# Patient Record
Sex: Female | Born: 1935 | Race: White | Hispanic: No | Marital: Married | Smoking: Former smoker
Health system: Southern US, Community
[De-identification: ages and names within clinical notes are randomized; demographics above are authoritative.]

## PROBLEM LIST (undated history)

## (undated) DIAGNOSIS — M810 Age-related osteoporosis without current pathological fracture: Secondary | ICD-10-CM

## (undated) DIAGNOSIS — E871 Hypo-osmolality and hyponatremia: Secondary | ICD-10-CM

## (undated) DIAGNOSIS — L439 Lichen planus, unspecified: Secondary | ICD-10-CM

## (undated) DIAGNOSIS — I495 Sick sinus syndrome: Secondary | ICD-10-CM

## (undated) DIAGNOSIS — S066X9A Traumatic subarachnoid hemorrhage with loss of consciousness of unspecified duration, initial encounter: Secondary | ICD-10-CM

## (undated) DIAGNOSIS — F0390 Unspecified dementia without behavioral disturbance: Secondary | ICD-10-CM

## (undated) DIAGNOSIS — Z8489 Family history of other specified conditions: Secondary | ICD-10-CM

## (undated) DIAGNOSIS — S066XAA Traumatic subarachnoid hemorrhage with loss of consciousness status unknown, initial encounter: Secondary | ICD-10-CM

## (undated) DIAGNOSIS — Z950 Presence of cardiac pacemaker: Secondary | ICD-10-CM

## (undated) DIAGNOSIS — C50919 Malignant neoplasm of unspecified site of unspecified female breast: Secondary | ICD-10-CM

## (undated) HISTORY — DX: Traumatic subarachnoid hemorrhage with loss of consciousness status unknown, initial encounter: S06.6XAA

## (undated) HISTORY — PX: APPENDECTOMY: SHX54

## (undated) HISTORY — DX: Malignant neoplasm of unspecified site of unspecified female breast: C50.919

## (undated) HISTORY — DX: Presence of cardiac pacemaker: Z95.0

## (undated) HISTORY — DX: Age-related osteoporosis without current pathological fracture: M81.0

## (undated) HISTORY — DX: Lichen planus, unspecified: L43.9

## (undated) HISTORY — PX: ABDOMINAL HYSTERECTOMY: SHX81

## (undated) HISTORY — DX: Traumatic subarachnoid hemorrhage with loss of consciousness of unspecified duration, initial encounter: S06.6X9A

## (undated) HISTORY — PX: TONSILLECTOMY: SUR1361

## (undated) HISTORY — DX: Hypo-osmolality and hyponatremia: E87.1

## (undated) HISTORY — DX: Sick sinus syndrome: I49.5

---

## 1994-12-29 DIAGNOSIS — C50919 Malignant neoplasm of unspecified site of unspecified female breast: Secondary | ICD-10-CM

## 1994-12-29 HISTORY — DX: Malignant neoplasm of unspecified site of unspecified female breast: C50.919

## 1994-12-29 HISTORY — PX: MASTECTOMY: SHX3

## 1995-12-30 HISTORY — PX: BREAST ENHANCEMENT SURGERY: SHX7

## 2000-02-28 ENCOUNTER — Ambulatory Visit (HOSPITAL_COMMUNITY): Admission: RE | Admit: 2000-02-28 | Discharge: 2000-02-28 | Payer: Self-pay | Admitting: Surgery

## 2004-06-19 ENCOUNTER — Ambulatory Visit (HOSPITAL_COMMUNITY): Admission: RE | Admit: 2004-06-19 | Discharge: 2004-06-19 | Payer: Self-pay | Admitting: Plastic Surgery

## 2004-06-19 ENCOUNTER — Ambulatory Visit (HOSPITAL_BASED_OUTPATIENT_CLINIC_OR_DEPARTMENT_OTHER): Admission: RE | Admit: 2004-06-19 | Discharge: 2004-06-19 | Payer: Self-pay | Admitting: Plastic Surgery

## 2005-06-19 ENCOUNTER — Ambulatory Visit (HOSPITAL_COMMUNITY): Admission: RE | Admit: 2005-06-19 | Discharge: 2005-06-19 | Payer: Self-pay | Admitting: Surgery

## 2008-11-24 ENCOUNTER — Ambulatory Visit: Payer: Self-pay | Admitting: Cardiology

## 2008-11-25 ENCOUNTER — Inpatient Hospital Stay (HOSPITAL_COMMUNITY): Admission: AD | Admit: 2008-11-25 | Discharge: 2008-11-30 | Payer: Self-pay | Admitting: Surgery

## 2008-11-27 ENCOUNTER — Encounter: Payer: Self-pay | Admitting: Internal Medicine

## 2008-11-27 HISTORY — PX: PACEMAKER INSERTION: SHX728

## 2008-11-28 ENCOUNTER — Ambulatory Visit: Payer: Self-pay | Admitting: Physical Medicine & Rehabilitation

## 2008-11-30 ENCOUNTER — Inpatient Hospital Stay (HOSPITAL_COMMUNITY)
Admission: RE | Admit: 2008-11-30 | Discharge: 2008-12-08 | Payer: Self-pay | Admitting: Physical Medicine & Rehabilitation

## 2008-11-30 ENCOUNTER — Ambulatory Visit: Payer: Self-pay | Admitting: Physical Medicine & Rehabilitation

## 2008-12-13 ENCOUNTER — Ambulatory Visit: Payer: Self-pay

## 2008-12-20 ENCOUNTER — Ambulatory Visit: Payer: Self-pay

## 2008-12-26 ENCOUNTER — Encounter: Admission: RE | Admit: 2008-12-26 | Discharge: 2008-12-26 | Payer: Self-pay | Admitting: Neurosurgery

## 2009-01-15 ENCOUNTER — Encounter
Admission: RE | Admit: 2009-01-15 | Discharge: 2009-04-15 | Payer: Self-pay | Admitting: Physical Medicine & Rehabilitation

## 2009-01-15 ENCOUNTER — Ambulatory Visit: Payer: Self-pay | Admitting: Physical Medicine & Rehabilitation

## 2009-02-14 ENCOUNTER — Encounter: Admission: RE | Admit: 2009-02-14 | Discharge: 2009-02-14 | Payer: Self-pay | Admitting: Neurosurgery

## 2009-02-23 ENCOUNTER — Encounter: Payer: Self-pay | Admitting: Internal Medicine

## 2009-03-02 ENCOUNTER — Ambulatory Visit: Payer: Self-pay | Admitting: Internal Medicine

## 2010-01-13 IMAGING — CT CT HEAD W/O CM
1 of 2 series · 15 of 30 positions shown, 19 images · non-contrast
Comparison: None.

CLINICAL DATA: 73-year-old female status post fall with left guide
bruising and head bleed, facial fracture.

CT HEAD WITHOUT CONTRAST
TECHNIQUE: Contiguous axial images were obtained from the base of
the skull through the vertex without contrast.

[Series 3: recon 2: brain · axial · 0.49mm/px · z∈[+122,+266]mm · 15 of 64 slices shown, 19 images]
[im 4/64  brain]
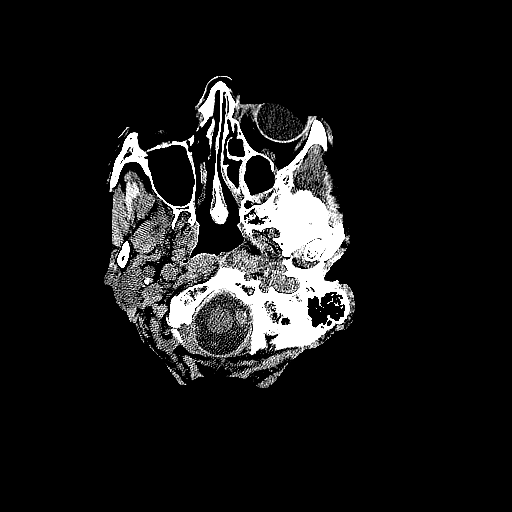
[im 4/64  bone]
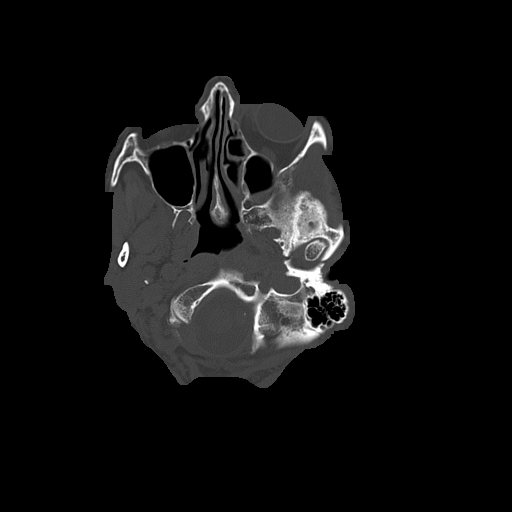
[im 7/64  brain]
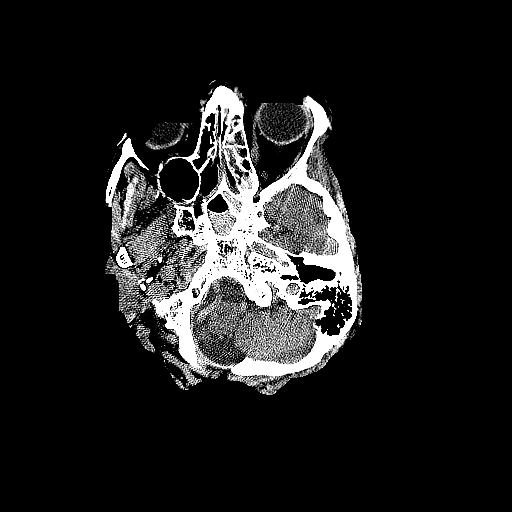
[im 14/64  brain]
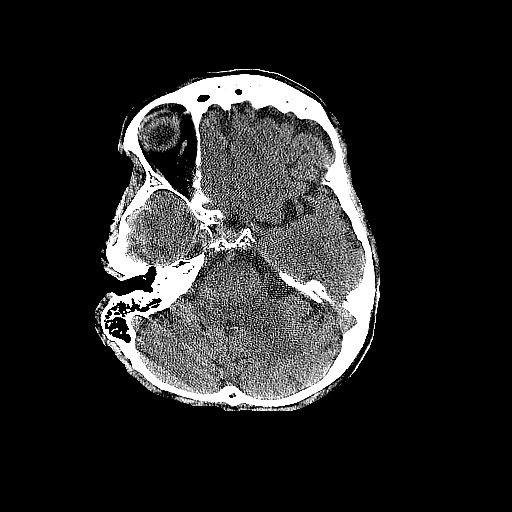
[im 17/64  brain]
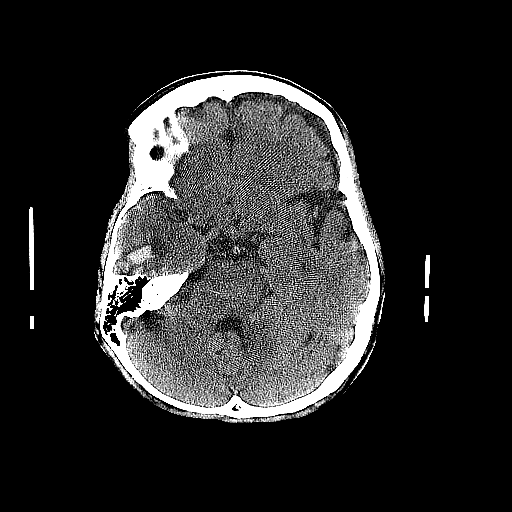
[im 20/64  brain]
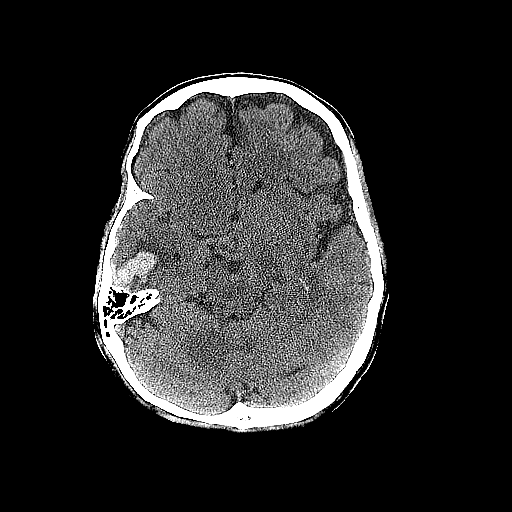
[im 20/64  bone]
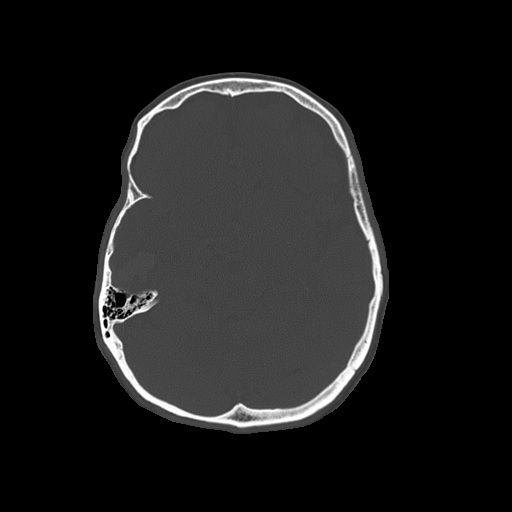
[im 24/64  brain]
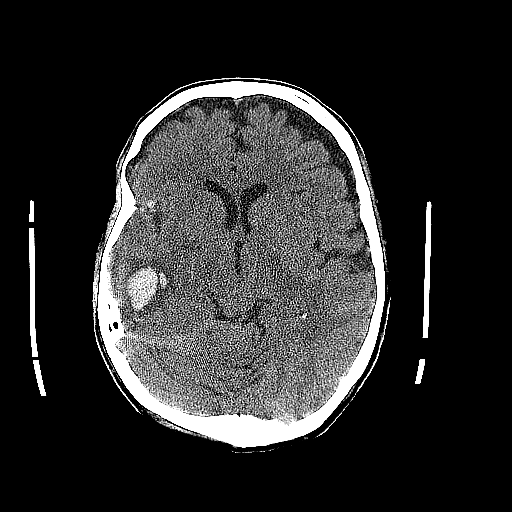
[im 27/64  brain]
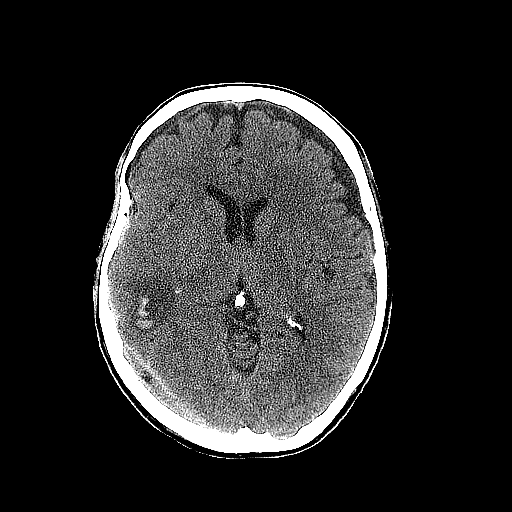
[im 34/64  brain]
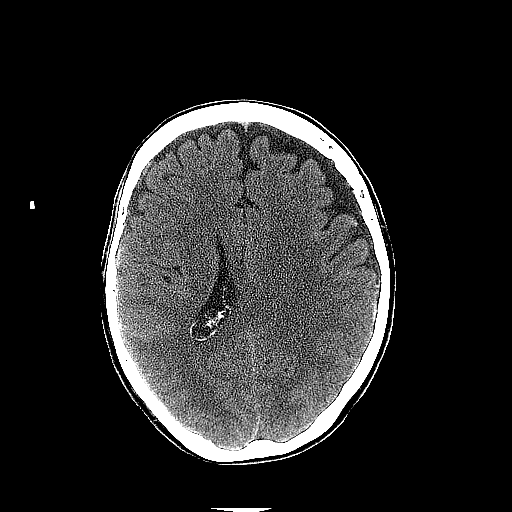
[im 37/64  brain]
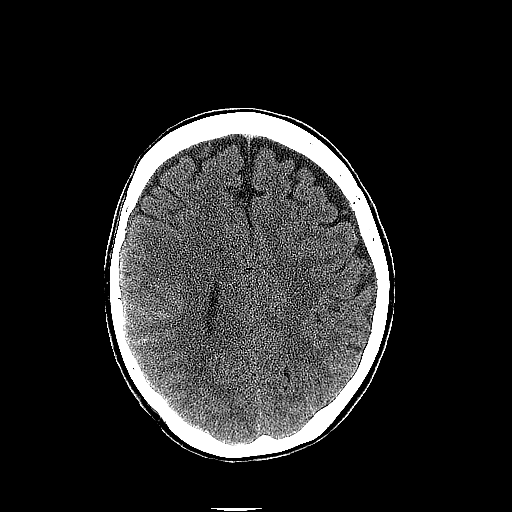
[im 37/64  bone]
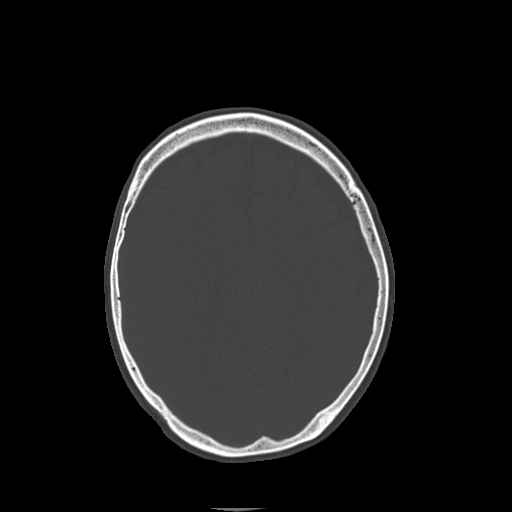
[im 40/64  brain]
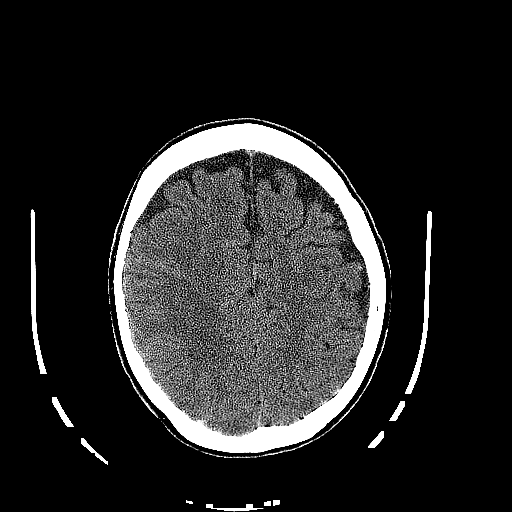
[im 44/64  brain]
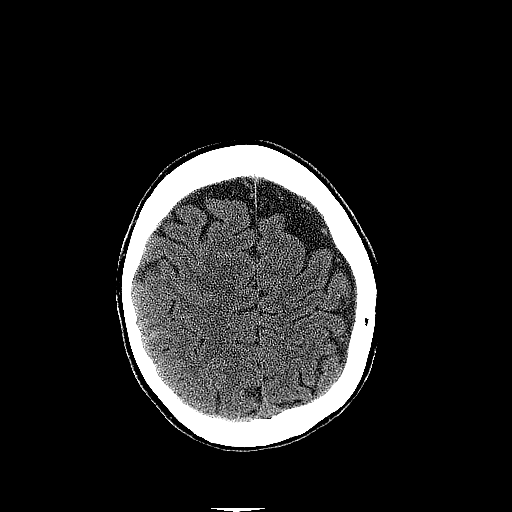
[im 47/64  brain]
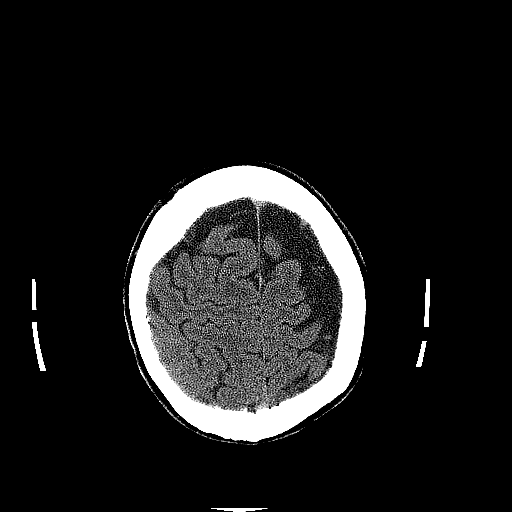
[im 54/64  brain]
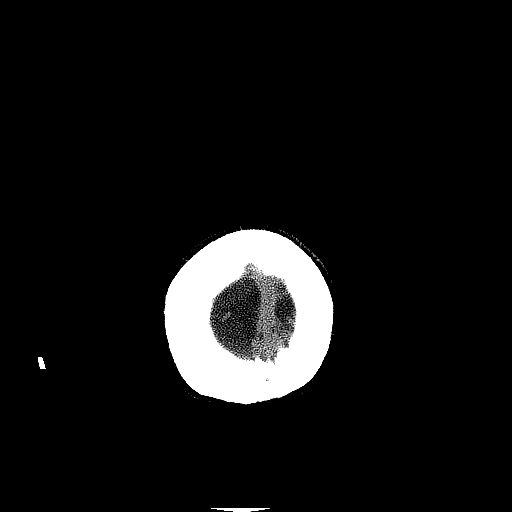
[im 54/64  bone]
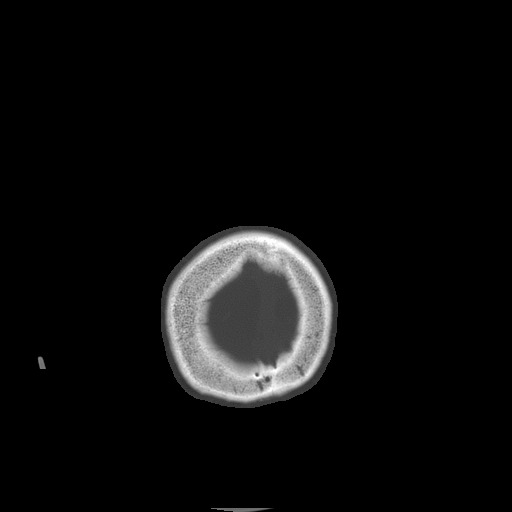
[im 57/64  brain]
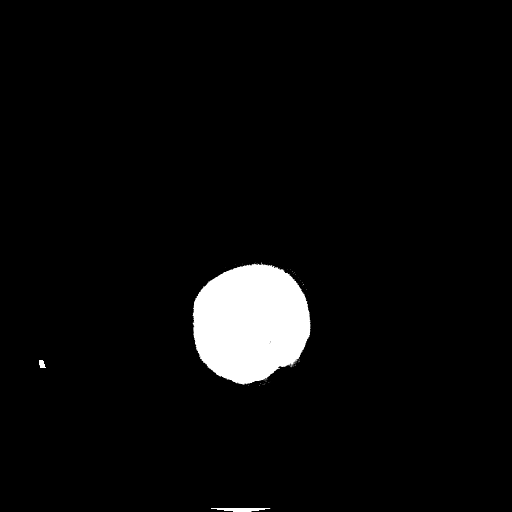
[im 60/64  brain]
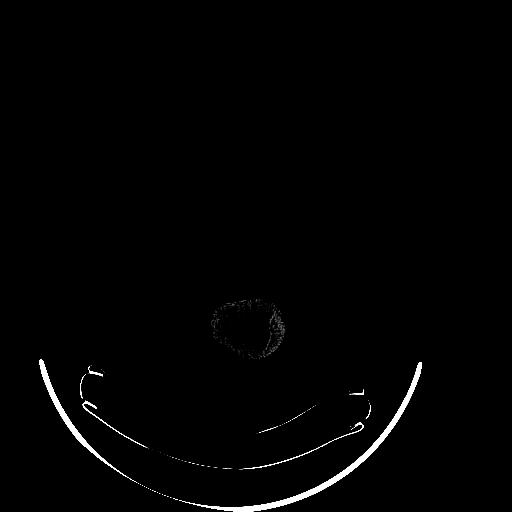

[15 of 30 positions shown; findings below may reference images not displayed]

FINDINGS: Left periorbital soft tissue hematoma partially
visualized.  The left globe appears intact.  Soft tissue swelling
extends to the left bridge of the nose.  There is a nondisplaced
fracture of the left lateral frontal bone which may involve the
lateral wall of the orbit or roof of the orbit, uncertain on these
images.  Opacification of the ethmoid and sphenoid sinuses
including an air-fluid level in the sphenoid.  Visualized mastoids
and tympanic cavities are clear.  No other calvarial fracture.

Scattered subarachnoid hemorrhage along the right cerebral
convexity.  Multi focal hemorrhagic contusions of the right
temporal lobe including the operculum, and the largest lesion of
the posterior right temporal lobe measuring 2.3 x 2.3 cm with
surrounding edema and mass effect on the right temporal horn.
There is a 5 mm left frontal convexity subdural hygroma.  No
midline shift.  No ventriculomegaly.  Incidental correlate plexus
cysts.  No intraventricular hemorrhage identified.  Basilar
cisterns are patent.  Probable small subdural hematoma versus
venous prominence in the medial left middle cranial fossa measuring
4-5 mm in thickness.  Outside of the right temporal lobe and right
operculum, gray-white matter differentiation is within normal
limits throughout the brain.
IMPRESSION: 1.  Small volume subarachnoid hemorrhage over the right convexity.
4-5 mm left frontal convexity subdural hygroma.  Possible small
left middle cranial fossa subdural hematoma.  No significant mass
effect or midline shift.
2.  Multi focal right temporal lobe hemorrhagic contusions, the
largest posteriorly measuring 2.3 cm with surrounding edema.
3.  Nondisplaced left frontal bone fracture which may involve the
left orbit.  Left periorbital and bridge of the nose soft tissue
hematoma.

## 2010-05-10 DIAGNOSIS — Z87820 Personal history of traumatic brain injury: Secondary | ICD-10-CM | POA: Insufficient documentation

## 2010-05-10 DIAGNOSIS — E871 Hypo-osmolality and hyponatremia: Secondary | ICD-10-CM | POA: Insufficient documentation

## 2010-05-13 ENCOUNTER — Ambulatory Visit: Payer: Self-pay | Admitting: Internal Medicine

## 2010-06-12 ENCOUNTER — Ambulatory Visit: Payer: Self-pay | Admitting: Internal Medicine

## 2010-09-11 ENCOUNTER — Encounter: Payer: Self-pay | Admitting: Internal Medicine

## 2010-09-11 ENCOUNTER — Ambulatory Visit: Payer: Self-pay | Admitting: Internal Medicine

## 2010-10-31 ENCOUNTER — Ambulatory Visit: Payer: Self-pay | Admitting: Internal Medicine

## 2010-12-11 ENCOUNTER — Ambulatory Visit: Payer: Self-pay | Admitting: Internal Medicine

## 2011-01-20 ENCOUNTER — Telehealth (INDEPENDENT_AMBULATORY_CARE_PROVIDER_SITE_OTHER): Payer: Self-pay | Admitting: *Deleted

## 2011-01-30 NOTE — Progress Notes (Signed)
Summary: Records Request  Faxed OV to [facility] at [facility] ([phone]). Debby Freiberg  January 20, 2011 1:23 PM

## 2011-01-30 NOTE — Cardiovascular Report (Signed)
Summary: TTM   TTM   Imported By: Roderic Ovens 10/03/2010 15:42:01  _____________________________________________________________________  External Attachment:    Type:   Image     Comment:   External Document

## 2011-01-30 NOTE — Cardiovascular Report (Signed)
Summary: Office Visit   Office Visit   Imported By: Roderic Ovens 11/07/2010 16:05:08  _____________________________________________________________________  External Attachment:    Type:   Image     Comment:   External Document

## 2011-01-30 NOTE — Cardiovascular Report (Signed)
Summary: TTM   TTM   Imported By: Roderic Ovens 07/08/2010 14:44:24  _____________________________________________________________________  External Attachment:    Type:   Image     Comment:   External Document

## 2011-01-30 NOTE — Cardiovascular Report (Signed)
Summary: TTM   TTM   Imported By: Roderic Ovens 01/03/2011 14:19:20  _____________________________________________________________________  External Attachment:    Type:   Image     Comment:   External Document

## 2011-01-30 NOTE — Assessment & Plan Note (Signed)
Summary: pc2   History of Present Illness: The patient presents today for routine electrophysiology followup. She reports doing very well since last being seen in our clinic. The patient denies symptoms of palpitations, chest pain, shortness of breath, orthopnea, PND, lower extremity edema, dizziness, presyncope, syncope, or neurologic sequela. The patient is tolerating medications without difficulties and is otherwise without complaint today.   Current Medications (verified): 1)  Chelated Molgbdenum 2)  Primal Defense .... Uad 3)  Fish Oil   Oil (Fish Oil) .... Uad  Allergies (verified): 1)  ! Codeine 2)  ! Penicillin 3)  ! Darvon 4)  ! * Darval 5)  ! Morphine 6)  ! Darvocet 7)  ! * Tape 8)  ! * Calcium  Past History:  Past Medical History: 1. Sinus node dysfunction with syncope, status post implantation of a     St. Jude Medical Zephyr XL model 5826 dual-chamber pacemaker     November 27, 2008. 2. Traumatic brain injury with a right frontal subarachnoid hemorrhage     November 2009. 3. Hyponatremia.   Vital Signs:  Patient profile:   76 year old female Height:      61 inches Weight:      87 pounds BMI:     16.50 Pulse rate:   79 / minute BP sitting:   100 / 80  (left arm)  Vitals Entered By: Laurance Flatten CMA (May 13, 2010 3:08 PM)  Physical Exam  General:  thin, NAD Head:  normocephalic and atraumatic Eyes:  PERRLA/EOM intact; conjunctiva and lids normal. Mouth:  Teeth, gums and palate normal. Oral mucosa normal. Neck:  Neck supple, no JVD. No masses, thyromegaly or abnormal cervical nodes. Chest Wall:  pacemaker pocket is well healed Lungs:  Clear bilaterally to auscultation and percussion. Heart:  Non-displaced PMI, chest non-tender; regular rate and rhythm, S1, S2 without murmurs, rubs or gallops. Carotid upstroke normal, no bruit. Normal abdominal aortic size, no bruits. Femorals normal pulses, no bruits. Pedals normal pulses. No edema, no  varicosities. Abdomen:  Bowel sounds positive; abdomen soft and non-tender without masses, organomegaly, or hernias noted. No hepatosplenomegaly. Msk:  Back normal, normal gait. Muscle strength and tone normal. Pulses:  pulses normal in all 4 extremities Extremities:  No clubbing or cyanosis. Neurologic:  Alert and oriented x 3.   PPM Specifications PPM Vendor:  St Jude     PPM Model Number:  [id number]     PPM Serial Number:  [id number] PPM DOI:  11/27/2008     PPM Implanting MD:  Hillis Range, MD  Lead 1    Location: [facility]     DOI: 11/27/2008     Model #: [id number]     Serial #: [id number]     Status: active Lead 2    Location: RV     DOI: 11/27/2008     Model #: [id number]     Serial #: [id number]     Status: active  Magnet Response Rate:  BOL 98.6 ERI 86.3  Indications:  Sick sinus syndrome   PPM Follow Up Remote Check?  No Battery Voltage:  2.80 V     Battery Est. Longevity:  8 years     Pacer Dependent:  No       PPM Device Measurements Atrium  Amplitude: 1.8 mV, Impedance: 437 ohms, Threshold: 0.75 V at 0.4 msec Right Ventricle  Amplitude: 6.1 mV, Impedance: 612 ohms, Threshold: 0.5 V at 0.4 msec  Episodes MS Episodes:  5  Percent Mode Switch:  <1     Coumadin:  No Atrial Pacing:  9     Ventricular Pacing:  1  Parameters Mode:  DDD`     Lower Rate Limit:  60     Upper Rate Limit:  120 Paced AV Delay:  200     Sensed AV Delay:  200 MD Comments:  normal pacemaker function no sustained arrhythmias  Impression & Recommendations:  Problem # 1:  PACEMAKER, ST. JUDE MEDICAL ZEPHYR XL MODEL 5826 (ICD-V45.01) Normal pacemaker function for sinus node dysfunction no changes TTMs every 3 months  Patient Instructions: 1)  Your physician recommends that you schedule a follow-up appointment in: 12 months with Dr Johney Frame

## 2011-01-30 NOTE — Cardiovascular Report (Signed)
Summary: Office Visit   Office Visit   Imported By: Roderic Ovens 05/21/2010 16:11:09  _____________________________________________________________________  External Attachment:    Type:   Image     Comment:   External Document

## 2011-01-30 NOTE — Procedures (Signed)
Summary: pacer check/st.jude   Current Medications (verified): 1)  Chelated Molgbdenum 2)  Primal Defense .... Uad 3)  Fish Oil   Oil (Fish Oil) .... Uad  Allergies (verified): 1)  ! Codeine 2)  ! Penicillin 3)  ! Darvon 4)  ! * Darval 5)  ! Morphine 6)  ! Darvocet 7)  ! * Tape 8)  ! * Calcium  PPM Specifications Following MD:  Hillis Range, MD     PPM Vendor:  St Jude     PPM Model Number:  [id number]     PPM Serial Number:  [id number] PPM DOI:  11/27/2008     PPM Implanting MD:  Hillis Range, MD  Lead 1    Location: RA     DOI: 11/27/2008     Model #: [id number]     Serial #: [id number]     Status: active Lead 2    Location: RV     DOI: 11/27/2008     Model #: 1646T     Serial #: [id number]     Status: active  Magnet Response Rate:  BOL 98.6 ERI 86.3  Indications:  Sick sinus syndrome   PPM Follow Up Remote Check?  No Battery Voltage:  2.80 V     Battery Est. Longevity:  9.75 years     Pacer Dependent:  No       PPM Device Measurements Atrium  Amplitude: 2.0 mV, Impedance: 412 ohms, Threshold: 0.75 V at 0.4 msec Right Ventricle  Amplitude: 6.1 mV, Impedance: 460 ohms, Threshold: 0.375 V at 0.4 msec  Episodes MS Episodes:  0     Percent Mode Switch:  0     Coumadin:  No Atrial Pacing:  11%     Ventricular Pacing:  <1%  Parameters Mode:  DDD`     Lower Rate Limit:  60     Upper Rate Limit:  120 Paced AV Delay:  200     Sensed AV Delay:  200 Next Cardiology Appt Due:  04/29/2011 Tech Comments:  Ventricular autocapture on today.  Device function normal.  ROV 6 months with Dr. Johney Frame. Altha Harm, LPN  October 31, 2010 3:25 PM

## 2011-03-12 ENCOUNTER — Encounter: Payer: Self-pay | Admitting: Internal Medicine

## 2011-03-12 DIAGNOSIS — I495 Sick sinus syndrome: Secondary | ICD-10-CM

## 2011-04-30 ENCOUNTER — Encounter: Payer: Self-pay | Admitting: Internal Medicine

## 2011-05-02 ENCOUNTER — Ambulatory Visit (INDEPENDENT_AMBULATORY_CARE_PROVIDER_SITE_OTHER): Payer: Medicare Other | Admitting: Internal Medicine

## 2011-05-02 ENCOUNTER — Encounter: Payer: Self-pay | Admitting: Internal Medicine

## 2011-05-02 DIAGNOSIS — I495 Sick sinus syndrome: Secondary | ICD-10-CM

## 2011-05-02 NOTE — Progress Notes (Signed)
The patient presents today for routine electrophysiology followup.  Since last being seen in our clinic, the patient reports doing very well.  Today, she denies symptoms of palpitations, chest pain, shortness of breath, orthopnea, PND, lower extremity edema, dizziness, presyncope, syncope, or neurologic sequela.  She remains very active and enjoys yard work and volunteering at her church. The patient feels that she is tolerating medications without difficulties and is otherwise without complaint today.   Past Medical History  Diagnosis Date  . Sinus node dysfunction     with syncope; status post implantation of a St. Jude Medical Zephyr XL model 5826 dual-chamber pacemaker  November 27, 2008.  . Subarachnoid hemorrhage following injury     right frontal, following traumatic brain injury  . Hyponatremia    Past Surgical History  Procedure Date  . Pacemaker insertion 11/27/08    by Fawn Kirk [facility])    Current Outpatient Prescriptions  Medication Sig Dispense Refill  . Fish Oil OIL by Does not apply route. uad         Allergies  Allergen Reactions  . Calcium   . Codeine   . Morphine   . Penicillins   . Propoxyphene Hcl   . Propoxyphene N-Acetaminophen     History   Social History  . Marital Status: Married    Spouse Name: N/A    Number of Children: N/A  . Years of Education: N/A   Occupational History  . Not on file.   Social History Main Topics  . Smoking status: Former Games developer  . Smokeless tobacco: Not on file  . Alcohol Use: No  . Drug Use: No  . Sexually Active: Not on file   Other Topics Concern  . Not on file   Social History Narrative  . No narrative on file   Physical Exam: Filed Vitals:   05/02/11 1105  BP: 101/75  Pulse: 77  Height: 5\' 2"  (1.575 m)  Weight: 87 lb (39.463 kg)    GEN- The patient is well appearing, alert and oriented x 3 today.   Head- normocephalic, atraumatic Eyes-  Sclera clear, conjunctiva pink Ears- hearing intact Oropharynx-  clear Neck- supple, no JVP Lymph- no cervical lymphadenopathy Lungs- Clear to ausculation bilaterally, normal work of breathing Chest- pacemaker pocket is well healed Heart- Regular rate and rhythm, no murmurs, rubs or gallops, PMI not laterally displaced GI- soft, NT, ND, + BS Extremities- no clubbing, cyanosis, or edema MS- no significant deformity or atrophy Skin- no rash or lesion Psych- euthymic mood, full affect Neuro- strength and sensation are intact  Pacemaker interrogation- reviewed in detail today,  See PACEART report  Assessment and Plan:

## 2011-05-02 NOTE — Assessment & Plan Note (Signed)
Normal pacemaker function See Pace Art report No changes today  

## 2011-05-13 NOTE — H&P (Signed)
[name], [name]             ACCOUNT NO.:  [id number]   MEDICAL RECORD NO.:  [id number]          PATIENT TYPE:  IPS   LOCATION:  4033                         FACILITY:  [facility]   PHYSICIAN:  Ellwood Dense, M.D.   DATE OF BIRTH:  Mar 12, 1935   DATE OF ADMISSION:  11/30/2008  DATE OF DISCHARGE:                              HISTORY & PHYSICAL   PRIMARY CARE PHYSICIAN:  Dr. Catha Gosselin.   NEUROSURGEON:  Dr. Lovell Sheehan.   EAR NOSE AND THROAT:  Dr. Suszanne Conners.   TRAUMA:  Dr. Diona Browner.   HISTORY OF PRESENT ILLNESS:  [name] is a 76 year old Caucasian  female who has been recently living in [place] but was [place] at a  [place] home [place] in the [facility] area.  She fell on November 25, 2008  in the kitchen of that home and the fall was unwitnessed.  She was taken  to an outside hospital in [facility] and a CAT scan showed scattered  subarachnoid hemorrhage with hemorrhagic contusion of the right frontal  lobe and a nondisplaced fracture of the left frontal lobe, left orbit  and left zygomatic arch.  The patient was transferred to [facility] on November 25, 2008 for follow-up evaluation and treatment.  A  CAT scan of the cervical spine showed degenerative changes and  spondylosis at C4-C7.  The patient was evaluated by trauma doctors and  neurosurgery.  Dr. Lovell Sheehan evaluated the patient and recommended  conservative care with serial CTs of her brain.  Dr. Suszanne Conners from ear, nose  and throat evaluated the patient and recommended followup after  discharge with no surgical intervention.  Trauma did not recommend any  surgical intervention.   The patient was noted to have episodes of asystole on November 26, 2008  and bradycardia.  [facility] was consulted for input.  The  patient was noted to have sick sinus syndrome and a pacemaker was  recommended by Dr. Johney Frame.  Transesophageal echocardiogram showed an  ejection fraction of 60% with mild-to-moderate tricuspid  valve  regurgitation.  A permanent pacemaker was placed November 27, 2008.  The  patient has had issues regarding confusion secondary to a traumatic  brain injury with some complaints of headaches.  Hypokalemia was noted  and the patient has been supplemented.  Therapies have been ongoing.  The patient shows some confusion with problems with memory and  occasional loss of balance and dizziness.  She does need cues to use her  left upper extremity appropriately.   Follow-up cranial CT November 27, 2008 showed stable to mildly increased  right posterior temporal lobe hemorrhagic contusion with stable edema  and mass effect.  The patient also had stable to slightly increased left  subdural hygroma noted.   The patient was evaluated by the rehabilitation physicians and felt to  be an appropriate candidate for inpatient rehabilitation.   REVIEW OF SYSTEMS:  Positive for weakness, wound healing of her left  chest, and bruising of her left eye.   PAST MEDICAL HISTORY:  1. COPD.  2. Osteoporosis.  3. Breast cancer with prior mastectomy with reconstruction.  4.  History of syncope.  5. Hysterectomy.  6. Diverticulosis.  7. A history of left wrist fracture.  8. Prior appendectomy.  9. Prior C-section.   FAMILY HISTORY:  Positive for coronary artery disease.   SOCIAL HISTORY:  The patient is married, lives with her husband in a one-  level home in [place] with no steps to enter.  She does not use  tobacco and reports occasional alcohol usage.   FUNCTIONAL HISTORY PRIOR TO ADMISSION:  Independent and driving prior to  admission.   FUNCTIONAL STATUS AT PRESENT:  Min assist bed mobility and transfers,  mod assist ambulation 3-4 steps with increasing levels of anxiety.  The  patient remains internally distracted with speech therapy evaluation.  She shows need for moderate assist for upper body care during  occupational therapy evaluations.   ALLERGIES:  DARVOCET AND AMOXICILLIN.    MEDICATIONS PRIOR TO ADMISSION:  Boniva.   LABORATORY:  Most recent hemoglobin was 12.6 with a hematocrit of 37.1,  platelet count of 149,000, and white count of 8.3.  Most recent sodium  was 133, potassium 4.0, chloride 98, bicarbonate 28, BUN 9, creatinine  0.40, glucose 97.  Chest x-ray November 28, 2008 showed no acute  infiltrate or edema with permanent pacemaker in place.  Recent TSH was  normal at 0.775.   PHYSICAL EXAM:  A reasonably well-appearing, elderly small adult female  lying in bed with a large bandage over her left chest and significant  bruising of her left orbit.  Blood pressure is 135/85 with a pulse of 67, respiratory rate 18, and  temperature 99.0.  HEENT:  Normocephalic apart from the bruising around her left eye and  left face.  No surgical wounds were present on her scalp.  There was no  obvious skin breakdown or laceration of her scalp.  CARDIOVASCULAR:  Regular rate and rhythm.  S1, S2 without murmurs.  ABDOMEN:  Soft, nontender, with positive bowel sounds.  LUNGS:  Clear to auscultation bilaterally.  Chest showed a dry dressing  over the permanent pacemaker in her left upper chest.  NEUROLOGIC:  Alert and oriented x2-3 with occasional cues.  The patient  has 4+/5 strength throughout the bilateral upper and lower extremities.  Bulk and tone were normal.  Sensation was intact to light touch  throughout the bilateral upper extremities.  Cranial nerve exam was  unremarkable with good eye closure and good facial symmetry with tongue  in the midline.  Bilateral lower extremity exam showed 4-/5 strength  throughout.  Bulk was decreased and tone was normal.   DIAGNOSES:  1. Status post fall with traumatic brain injury with right frontal      hemorrhagic contusion and right frontal skull fracture along with      right orbital and zygomatic arch fractures along with scattered      subarachnoid hemorrhage.  2. History of syncope with recent diagnosis of sick sinus  syndrome      with permanent pacemaker placed November 27, 2008.  3. Hyponatremia possibly related to cerebral salt wasting secondary to      traumatic brain injury.  Will recheck electrolytes in the morning.  4. Deep vein thrombosis prophylaxis with no blood thinners secondary      to recent cranial injury and subarachnoid hemorrhage with ongoing      pneumatic air stockings for deep vein thrombosis prophylaxis.   Presently the patient will be admitted to receive collaborative and  interdisciplinary care between the physiatrist, rehab nursing staff and  therapy team, at least 3 hours per day at least 5 days per week.  The  patient's level of medical complexity and substantial therapy needs in  context of that medical necessity cannot be provided at a lesser  intensity of care.  Physiatrist will provide 24-hour management of  medical needs as well as oversight of therapy, plan/treatment, and  provide guidance as appropriate regarding interaction of the two and 24-  hour rehab nursing will assist in management of bowel and bladder and  help integrate therapy concepts, techniques and education.  1. Physical therapy will assess and treat for range of motion,      strengthening, bed mobility, transfers, pre gait training, gait      training and equipment.  2. Occupational therapy will assess and treat for range of motion,      strengthening, activities of daily living,  cognitive/perceptional      training, splinting and equipment eval.  3. Speech therapy will assess and treat for dysphagia, oral motor      exercise, communication aids, aphasia and VitalStim as necessary.  4. Case management and social worker will assess and treat for      psychosocial issues and discharge planning as appropriate.  Team      conferences will be held weekly to establish goals, assess progress      and determine barriers to discharge.   PROGNOSIS:  Good.   ESTIMATED LENGTH OF STAY:  5-12 days.   GOALS:   Modified independent to standby assist with ADLs, transfers and  ambulation.           ______________________________  Ellwood Dense, M.D.     DC/MEDQ  D:  11/30/2008  T:  11/30/2008  Job:  [id number]

## 2011-05-13 NOTE — Assessment & Plan Note (Signed)
[facility]                         ELECTROPHYSIOLOGY OFFICE NOTE   NAME:Kemmerling, RUPINDER LIVINGSTON                    MRN:          [id number]  DATE:03/02/2009                            DOB:          09-28-1935    INTRODUCTION:  Ms. [name] is a pleasant 76 year old female with sinus  node dysfunction status post implantation of a St. [employer] Medical Zephyr  XL model 5826 dual-chamber pacemaker, presents today for followup.   PROBLEM LIST:  1. Sinus node dysfunction with syncope, status post implantation of a      St. [employer] Medical Zephyr XL model 5826 dual-chamber pacemaker      November 27, 2008.  2. Traumatic brain injury with a right frontal subarachnoid hemorrhage      November 2009.  3. Hyponatremia.   CURRENT MEDICATIONS:  1. Fish oil daily.  2. Multivitamin daily.   INTERVAL HISTORY:  Ms. [name] presents today for followup.  She fell  and sustained a traumatic brain injury in November 2009.  She was  evaluated by me and underwent implantation of a St. Jude Medical Zephyr  XL dual-chamber pacemaker November 27, 2008.  She was found to have had  prolonged pauses while on telemetry during her hospital stay and the  pacemaker was therefore implanted for sinus node dysfunction.   She reports doing very well since her hospital discharge.  She continues  to make a remarkable recovery.  She has occasional dizziness which  appears to be neurologic in origin.  She denies chest pain, shortness of  breath, palpitations, presyncope, syncope, fevers, chills or other  concerns.  She has had an uneventful course since her pacemaker was  implanted and is otherwise done quite well.   PHYSICAL EXAMINATION:  VITALS:  Blood pressure 115/80, heart rate 77,  respirations 18, weight 94 pounds.  GENERAL:  The patient is a very thin female in no acute distress.  She  is alert and oriented x3.  HEENT:  Normocephalic, atraumatic.  Sclerae clear.  Conjunctivae pink.  Oropharynx clear.  NECK:  Supple.  No JVD, thyromegaly, or bruits.  LUNGS:  Clear to auscultation bilaterally.  HEART:  Regular rate and rhythm.  No murmurs, rubs or gallops.  GI:  Soft, nontender, nondistended, positive bowel sounds.  EXTREMITIES:  No clubbing, cyanosis or edema.  NEUROLOGIC:  Strength and sensation are intact.  SKIN:  No ecchymoses or laceration.  MUSCULOSKELETAL:  No deformity or atrophy.  PSYCH:  Euthymic, mood flat, full affect.   The pacemaker pocket is well healed with no evidence of hematoma or  infection.   EKG reveals sinus rhythm at 77 beats per minute and is otherwise  unremarkable.   DEVICE INTERROGATION:  The patient's dual-chamber pacemaker is  interrogated today and found to be functioning appropriately in the DDD  pacing mode with a lower rate limit of 60 and upper tracking rate of 120  beats per minute.  The battery status is good with a voltage of 2.800  volts and an estimated longevity of 6-10 years.  The atrial lead P-wave  measures 1.4 mV with an impedance of 455 ohms and a  threshold of 0.75  volts at 0.56 milliseconds.  The right ventricular lead R-wave measures  9.9 mV with an impedance is 650 ohms and a threshold of 0.5 volts at 0.5  milliseconds.  The underlying rhythm is sinus rhythm.  The patient is  paced in the atrium and ventricle less than 1% at that time.  The AV  delays are programmed at 200/200 milliseconds.  The atrial and  ventricular pacing outputs were decreased today 2 volts of 0.4  milliseconds and 2.5 volts at 0.4 milliseconds respectively.  No other  programming changes were made today.   IMPRESSION:  1. Sinus node dysfunction.  2. Normal pacemaker function.   PLAN:  1. No medication changes were made today.  2. The patient was reprogrammed as described above to promote battery      longevity.  3. The patient will follow up with me annually.  She was instructed to      contact me at any time should any problems  arise.     Hillis Range, MD  Electronically Signed    JA/MedQ  DD: 03/02/2009  DT: 03/03/2009  Job #: [id number]   cc:   Cristi Loron, M.D.  Anna Genre Little, M.D.

## 2011-05-13 NOTE — Discharge Summary (Signed)
[name], [name]             ACCOUNT NO.:  [id number]   MEDICAL RECORD NO.:  [id number]          PATIENT TYPE:  INP   LOCATION:  3023                         FACILITY:  [facility]   PHYSICIAN:  [name]. [name], M.D.DATE OF BIRTH:  1935-07-22   DATE OF ADMISSION:  11/25/2008  DATE OF DISCHARGE:  11/30/2008                               DISCHARGE SUMMARY   ADMITTING TRAUMA SURGEON:  Elaine Bales. Ezzard Standing, MD   CONSULTANTS:  1. Elaine Loron, MD, Neurosurgery.  2. Elaine Pies, MD, ENT.  3. Elaine Sidle, MD, [facility].   DISCHARGE DIAGNOSES:  1. Fall from level ground.  2. Traumatic brain injury with intracerebral contusion, subarachnoid      hemorrhage, and small subdural hematoma.  3. Left frontal skull fracture.  4. Zygoma and orbital wall fractures on left.  5. Sick sinus syndrome with sinus arrest.  6. Osteoporosis.  7. Hypokalemia, resolved.  8. Mild hyponatremia.   PROCEDURES:  Dual-chamber pacemaker placement on November 27, 2008, Dr.  Johney Gallagher.   HISTORY:  This is an otherwise fairly healthy 76 year old white female  who was at her [name] in [place] when she had an  unwitnessed fall.  She struck the left side of her head and face.  She  was seen [name] [place], and a head  CT scan there showed some scattered subarachnoid hemorrhage and small  subdural as well as an intracerebral contusion in the right temporal  lobe.  She had a nondisplaced left frontal bone fracture and  nondisplaced fractures of the left orbital wall and left zygomatic arch.  She had degenerative changes of her cervical spine but no acute  fractures.  The patient was accepted in transfer from [name],  as she lives locally in [name] with her husband.  She was admitted  to the Neuro Intensive Care Unit and a consultation was obtained from  Dr. Lovell Sheehan for her intracerebral trauma, and he reviewed the patient's  scan and felt that she  had a small right subdural hematoma without mass  effect and small bilateral subarachnoid hemorrhages in the sylvian  fissure.  He also noted the patient's nondisplaced facial and left  frontal skull fractures.  It was recommended the patient be observed in  the Neuro Intensive Care Unit initially and this was done and the  patient did well.  She remained generally somnolent but arousable and  became much more alert the day following her admission.  She had some  mild orientation difficulties.  The followup CT scan showed new small  right temporal contusion, right subdural hematoma, and some subarachnoid  hemorrhage; all of which were felt to be stable and the patient was  clinically stable and improving.  The patient was seen in consultation  by Dr. Suszanne Conners for her facial fractures, and these were felt to be  nondisplaced and conservative treatment was recommended.  The patient  can follow up with Dr. Suszanne Conners following her discharge from the [facility].  The patient had been doing well; however, she was noted to have a long  episode of asystole/sinus  arrest and this was felt to be due to sick  sinus syndrome.  An urgent [facility] consultation was obtained from  [name] following this episode from Dr. Diona Browner, and the  patient subsequently underwent dual-chamber permanent pacemaker  placement on November 27, 2008.  She tolerated all of this well except  for some mild over sedation with the medications during the pacemaker  placement.  She did experience some confusion following this, but this  rapidly improved and she was making progress with her rehabilitation,  and it was recommended that she be admitted for a brief inpatient  rehabilitation stay to address her continued deficits following her fall  with intracerebral contusion, subarachnoid and subdural hematomas.  The  patient is being transferred to the [facility]  here at [name] at this time for this  purpose.   MEDICATIONS AT THE TIME OF DISCHARGE:  1. Norco 5/325 mg 1-2 p.o. q.4 h. p.r.n. pain.  2. She is receiving a last dose of potassium chloride 20 mEq x1 this      morning.  3. She was also started on sodium chloride tablets 1 p.o. t.i.d. with      meals for some mild hyponatremia and this will continue to require      monitoring.   Following her discharge, she should be followed by [name],  by Dr. Lovell Sheehan of Neurosurgery, and Dr. Suszanne Conners, ENT.  She does not need  formal followup with the Trauma Service.   Diet is regular.      Shawn Rayburn, P.A.      [name], M.D.  Electronically Signed    SR/MEDQ  D:  11/30/2008  T:  11/30/2008  Job:  [id number]   cc:   Elaine Sidle, MD  [name], M.D.  Elaine Pies, MD  [name]

## 2011-05-13 NOTE — Op Note (Signed)
NAMEALLYANNA, APPLEMAN             ACCOUNT NO.:  [id number]   MEDICAL RECORD NO.:  [id number]          PATIENT TYPE:  INP   LOCATION:  3102                         FACILITY:  [facility]   PHYSICIAN:  Hillis Range, MD       DATE OF BIRTH:  1935-07-27   DATE OF PROCEDURE:  DATE OF DISCHARGE:                               OPERATIVE REPORT   SURGEON:  Hillis Range, MD   PREPROCEDURE DIAGNOSES:  1. Syncope.  2. Sick sinus syndrome.  3. Symptomatic sinus bradycardia.   PROCEDURES:  1. Dual-chamber pacemaker implantation.  2. Left upper extremity venography   INTRODUCTION:  Ms. Cardamone is a pleasant 76 year old female who was  admitted following a syncopal episode which occurred on November 25, 2008.  Following admission, she was observed on telemetry and found to  have prolonged ventricular pauses.  She had a 11.2-second pause followed  by a 23-second pause with subsequent syncope.  She was found to have a  preserved ejection fraction with no reversible causes of bradycardia  observed.  She therefore presents for dual-chamber pacemaker  implantation.   DESCRIPTION OF THE PROCEDURE:  Informed written consent was obtained and  the patient was brought to the Electrophysiology Lab in a fasting state.  She was adequately sedated with intravenous Valium, Versed and fentanyl  as outlined in the nursing report.  The patient's left chest wall was  infiltrated with lidocaine for local analgesic.  A 5-cm incision was  then made over the left deltopectoral region.  A subcutaneous pacemaker  pocket was fashioned using a combination of blunt and sharp dissection.  Electrocautery was used to assure hemostasis.  The left cephalic vein  could not be visualized.  A venogram of the left upper extremity was  therefore performed by hand injection of nonionic contrast which  demonstrated small but patent left axillary and left subclavian veins.  The left axillary vein was cannulated and through this vein  a St. Jude  Medical Tendril SDX model 1688 TC - 46 (serial number [id number]) right  atrial lead and a St. Jude Medical IsoFlex S model 1646T - 58 (serial  number [id number]) right ventricular lead were advanced with fluoroscopic  visualization into the right atrial appendage and right ventricular apex  positions respectively.  The initial lead measurements revealed an  atrial lead P-wave of 3.2 mV with an impedance of 813 ohms and a  threshold of 0.6 volts at 0.5 milliseconds.  The right ventricular lead,  the R-wave measured 14.4 mV with an impedance of 904 ohms and a  threshold of 0.3 volts at 0.5 milliseconds.  The leads were then  connected to a St. Jude Medical Zephyr XL DR model [phone] (serial number  [id number]) dual-chamber pacemaker.  The pocket was then irrigated with  copious gentamicin solution.  The pacemaker was then placed into the  left subcutaneous pacemaker pocket.  The device was secured to the  pectoralis fascia using a single #2 silk suture.  The leads were secured  to the pectoralis fascia using #2 silk sutures over the suture sleeves.  The pocket was then closed with  2-0 Vicryl suture for the subcutaneous  and subcuticular layers.  There were no early apparent complications.   CONCLUSIONS:  1. Successful implantation of a Zephyr XL dual-chamber pacemaker as      outlined above.  2. No early apparent complications.      Hillis Range, MD  Electronically Signed     JA/MEDQ  D:  11/27/2008  T:  11/28/2008  Job:  [id number]   cc:   Jonelle Sidle, MD  Gabrielle Dare. Janee Morn, M.D.

## 2011-05-13 NOTE — H&P (Signed)
NAMEKAILYNNE, FERRINGTON             ACCOUNT NO.:  [id number]   MEDICAL RECORD NO.:  [id number]          PATIENT TYPE:  INP   LOCATION:  3102                         FACILITY:  [facility]   PHYSICIAN:  Sandria Bales. Ezzard Standing, M.D.  DATE OF BIRTH:  1935/07/09   DATE OF ADMISSION:  11/25/2008  DATE OF DISCHARGE:                              HISTORY & PHYSICAL   Date of admission ??   HISTORY OF PRESENT ILLNESS:  Ms. Talamante is a 76 year old white  [place].  This  history is taken much from her husband.  Apparently, in trying to talk  to her, she felt she may have misstepped, though according to her  husband, there is a questionable fall maybe a couple of months ago,  which again was sort of unwitnessed, but she did not have any real  problems with.   At this time, she went to the [facility] [place] in  [place].  [place] and I was called by the ER physician there for consideration for  transfer, to our trauma service.   In [place], they did a CT of her head.  This showed scattered regions  of subarachnoid hemorrhage in the right sylvian fissure and bilateral  parietotemporal sulci.  She had a hemorrhagic contusion about 5 mm in  her right temporal lobe and nondisplaced fracture of left frontal bone.  There also was a nondisplaced fracture of her anterior wall of her left  orbit and nondisplaced fracture of the left zygomatic arch, has some  soft tissue swelling in the left face.   She had a CT scan of her neck, which showed degenerative disease at C4-5  and C5-6, but no obvious acute fractures and she has some paranasal  sinus disease.   Her chest x-ray exclusive of thoracic spine were unremarkable.  Her  chest x-ray shows some chronic emphysematous changes.   I accepted her transfer.   PAST MEDICAL HISTORY:  She has had no allergies.  I think her only  medication is Boniva.   REVIEW OF SYSTEMS:  NEUROLOGIC:  She has had no  prior seizure or loss of  consciousness  according to her husband.  She may had a fall a couple of  months ago.  So a only question is there a reason for a syncopal workup,  but her current presentation appears to be a fall.  GASTROINTESTINAL:  She has had no peptic ulcer disease or liver disease.  UROLOGIC:  No kidney disease.   PHYSICAL EXAMINATION:  GENERAL:  She is arousable.  She moves her upper  and lower extremities with intention, is alert, cooperative, may be a  little bit drowsy.  She is a thin woman.  HEENT:  She has contusion, bruising around her left eye.  Her pupils,  however, are about 3-4 mm and bilaterally reactive.  She has inner soft  tissue swelling of her left face.  She has no obvious tooth fractures.  NECK:  Nontender.  She moves without pain.  She has no cervical or  supraclavicular adenopathy.  LUNGS:  Clear to auscultation.  Her breath sounds seem symmetric.  HEART:  Regular rate and rhythm without murmur or rub.  ABDOMEN:  Soft.  I feel no mass.  Her pelvis is stable.  EXTREMITIES:  She has no obvious fracture or injury to upper and lower  extremities.  She is right-handed and has good strength in upper and  lower extremities.   LABORATORIES:  Labs that I have from [facility]  shows a BUN of 14, creatinine of 0.6, glucose of 116, potassium 3.7.  Her hemoglobin is 12.9, hematocrit 37, and white blood count of 6400.  EKG showed a normal sinus rhythm.   IMPRESSION:  1. Fall with intracranial facial injuries.  2. Scattered subarachnoid hemorrhage.  I have asked Dr. Tressie Stalker to see her in consultation.  We will plan on repeat CT scan      in the morning.  3. Left frontal bone, left orbit, left zygomatic arch fractured, which      were undisplaced.  I have asked Dr. Suszanne Conners to see her from a facial      fracture standpoint.  4. History of osteoporosis.  5. Chronic obstructive pulmonary disease on chest x-ray.      Sandria Bales.  Ezzard Standing, M.D.  Electronically Signed     DHN/MEDQ  D:  11/25/2008  T:  11/26/2008  Job:  [id number]   cc:   Caryn Bee L. Little, M.D.  Cristi Loron, M.D.  Newman Pies, MD

## 2011-05-13 NOTE — Consult Note (Signed)
[name], [name]             ACCOUNT NO.:  [id number]   MEDICAL RECORD NO.:  [id number]          [facility] TYPE:  INP   LOCATION:  3102                         FACILITY:  [facility]   PHYSICIAN:  Hillis Range, MD       DATE OF BIRTH:  [birth year]   DATE OF CONSULTATION:  DATE OF DISCHARGE:                                 CONSULTATION   CONSULTING PHYSICIAN:  Jonelle Sidle, MD   REASON FOR CONSULTATION:  Symptomatic bradycardia.   HISTORY OF PRESENT ILLNESS:  [name] is a pleasant 76 year old  female without significant past medical history who is admitted on  November 25, 2008, following a syncopal episode.  [name]'s spouse  reports that she was in her usual state of health ambulating within  there home when he heard her collapse to Elaine floor.  Upon arriving into  [facility] room, [name] was found to be lying on Elaine floor unconscious.  She apparently regained consciousness within several seconds and was  taken to [facility] [place].  She was found to have  facial fractures, a subarachnoid hemorrhage, a subdural hematoma, and  hemorrhagic contusions.  She was brought to [facility] for  further evaluation.  On November 26, 2008, while resting in her hospital  bed [name] again lost consciousness.  During Elaine episode, she was  observed to have 11.9 second pause followed by an junctional escape beat  and then a 23.6 second pause followed by several additional junctional  beats before returning to sinus bradycardia.  [name] lost  consciousness during this event, but subsequently woke on her own  without resuscitation.  She has had no further erythremia since that  time.   Upon further discussion with [name]'s spouse she has had a prior  episode of syncope a several weeks ago at which time she again fell, but  did not sustain significant injury.  She did not seek medical attention  at that time.  Over Elaine past several years, [name]  is felt to have  multiple episodes of lightheadedness and presyncope without major  injury.  She denies chest discomfort, shortness of breath, palpitations,  or symptoms of heart failure.  She is otherwise without complaint.   ALLERGIES:  DARVOCET and AMOXICILLIN.   HOME MEDICATIONS:  Boniva.   PAST MEDICAL HISTORY:  1. Breast cancer status post right mastectomy and axillary dissection      in Elaine late 1990s.  2. Osteoporosis.  3. Remote tobacco use.  4. Hypertension.   SOCIAL HISTORY:  [name] is married and lives with her spouse in  [place].  She has a remote history of tobacco, but quit approximately  15 years ago.  She drinks occasional wine and denies drug use.   FAMILY HISTORY:  [name]'s mother had congestive heart failure in  her 18s.   REVIEW OF SYSTEMS:  All systems were reviewed and negative except as  outlined in Elaine HPI above.   PHYSICAL EXAMINATION:  VITAL SIGNS:  Blood pressure 143/75, heart rate  68, respirations 18, temperature 98.1 (T-max 100.2), and sats  100% on  room air.  GENERAL:  [name] is a thin female in no acute distress.  She is  sleeping, but arouses.  HEENT:  [name] has a left periorbital ecchymosis with a swelling.  Sclerae clear.  Conjunctivae pink.  Oropharynx is clear.  NECK:  Supple.  No thyromegaly, lymphadenopathy, or JVD.  No bradycardia  to carotid sinus massage.  LUNGS:  Clear to auscultation bilaterally.  HEART:  Regular rate and rhythm.  No murmurs, rubs, or gallops.  GI:  Soft, nontender, and nondistended.  Positive bowel sounds.  EXTREMITIES:  No clubbing, cyanosis, or edema.  SKIN:  [name] has ecchymosis over Elaine left periorbital region;  otherwise, without laceration.  MUSCULOSKELETAL:  No deformity or atrophy.  PSYCH:  Euthymic mood.  Full affect.   LABORATORY DATA:  TSH 0.775, sodium 133, potassium 4.1, and creatinine  0.36.  White blood cell count 7, hematocrit 35, and platelets 157.   EKG reveals  sinus rhythm at 66 beats per minute, otherwise normal.   Telemetry, I have reviewed [name]'s telemetry in detail and  confirmed that she had an 11.9 sec pause followed by a single junctional  beat followed by a 23.6 second pause and then several junctional beats  before returning to sinus bradycardia.  No other significant pauses or  bradycardic events have been observed.   IMPRESSION:  [name] is a pleasant 76 year old female who presents  following a syncopal episode of an unclear etiology.  Certainly, [facility]  [facility]'s telemetry has captured sinus node dysfunction with prolonged  RR intervals with a syncopal episode during her hospital stay.  I  suspect that this is Elaine culprit rhythm to [name]'s prior syncopal  event.  An echocardiogram has been obtained and we will review this to  further evaluate [name]'s ejection fraction.  If her ejection  fraction is preserved then I think that we should proceed with dual-  chamber pacemaker implantation.  However, should [name] have a  depressed ejection fraction or significant wall motion abnormalities  then it may be prudent to proceed to left heart catheterization followed  by EP study to evaluate for ventricular arrhythmias.  [name] has  had a temperature of 100.2, though no significant temperatures above  this or symptoms to suggest infection.  We will however obtain a  urinalysis and chest x-ray.  If these reveal infection then we will  defer device implantation; however, if they are within Elaine normal range  we would proceed with pacemaker implantation later today.   PLAN:  1. Transthoracic echocardiogram to evaluate ejection fraction.  2. Pacemaker if ejection fraction is preserved, left heart      catheterization, and EP study if her ejection fraction is abnormal.  3. Continue to avoid AV nodal blocking agents in Elaine interim.      Hillis Range, MD  Electronically Signed     JA/MEDQ  D:  11/27/2008   T:  11/27/2008  Job:  [id number]   cc:   Jonelle Sidle, MD

## 2011-05-13 NOTE — Consult Note (Signed)
NAMEEMMORY, SOLIVAN             ACCOUNT NO.:  [id number]   MEDICAL RECORD NO.:  [id number]          PATIENT TYPE:  INP   LOCATION:  3102                         FACILITY:  [facility]   PHYSICIAN:  Jonelle Sidle, MD DATE OF BIRTH:  [birth year]   DATE OF CONSULTATION:  11/26/2008  DATE OF DISCHARGE:                                 CONSULTATION   PRIMARY CARDIOLOGIST:  New to [facility] being seen by Dr.  Nona Dell.   PATIENT PROFILE:  A 76 year old married Caucasian female without prior  cardiac history whom we were asked to see secondary to severe  symptomatic bradycardia/sinus arrest.   PROBLEMS:  1. Sinus arrest/sick sinus syndrome with pauses of 11.92, 23.60, and      7.95 seconds.  2. Recent syncope resulting in fall, likely related to problem number      1.      a.     Head CT small volume subarachnoid hematoma over the right       convexity.      b.     4-5 mm left frontal convexity subdural hygroma.      c.     Positive small left middle cranial subdural hematoma.      d.     Multifocal right temporal lobe hemorrhagic contusions.      e.     Nondisplaced left frontal bone fracture.  3. Breast cancer.      a.     Status post right mastectomy and axillary dissection late       1990s.  4. Osteoporosis.  5. Remote tobacco abuse quitting about 15 years ago.   HISTORY OF PRESENT ILLNESS:  A 76 year old Caucasian female without  prior cardiac history.  She reports a several year history of  intermittent syncope associated with lightheadedness occurring without  major injury.  She also reports a daily history of lightheadedness that  has been occurring over the past month.  These episodes occur while  sitting or standing at rest or with exertion several times per day,  lasting a few minutes and resolving spontaneously.  Yesterday, she [place]  her [place]  While at the place where they  were staying, her husband heard a loud thump and  walked into the room to  find his wife face first on the floor.  She apparently regained  consciousness fairly quickly and she was taken to [facility] of  [place].  In the ED, they tried to check orthostatics but  apparently she was too dizzy to sit up.  Head CT showed facial  fractures, subarachnoid hemorrhage, subdural hematoma and hemorrhagic  contusions.  She was transferred to [facility], as she lives in  [place], for further evaluation.  Here radiology reports reviewed by  Trauma and Neurosurgery and decision was made that she did not need any  neurosurgical intervention at this time.  This morning while in bed, the  patient was witnessed to experience recurrent syncopal episodes in the  setting of sinus pauses of 11.92 seconds followed by PAC and then a  23.60 second pause followed  by a PAC and then a 7.95 second pause.  The  patient then exhibited sinus bradycardia with PACs and then sinus rhythm  into the 70s.  She did not require any CPR, pacing, or  atropine/epinephrine.  Currently, the patient is lethargic having  received Zofran and morphine earlier today.  She is alert and oriented,  however.  She denies any chest pain or shortness of breath in  association with her syncope earlier.  Her rhythm is now stable.   ALLERGIES:  She is allergic to DARVOCET and AMOXICILLIN.   HOME MEDICATIONS:  Sandrea Hammond, here she is on p.r.n. morphine and Zofran.   FAMILY HISTORY:  Mother died of an MI at age 5.   SOCIAL HISTORY:  She is married and lives with her husband in  [place].  She has two grown children.  She is retired.  She has a  remote history of tobacco abuse quitting about 15 years ago.  She denies  any alcohol or drug use.  She is active at home.   REVIEW OF SYSTEMS:  Positive for lightheadedness, presyncope, or  syncope.  The patient also has headaches in the setting of her injuries.  Otherwise, all systems reviewed and negative.   PHYSICAL EXAMINATION:   VITAL SIGNS:  Temperature 99.7, heart rate 73,  respirations 14, blood pressure 108/59, pulse ox 98% on room air, intake  760, output 700.  GENERAL:  Pleasant, lethargic white female in no acute distress, awake,  alert and oriented x3.  HEENT:  Notable for left periorbital ecchymosis and swelling, otherwise  normal.  NEURO:  Grossly intact, nonfocal.  SKIN:  Warm and dry without lesions or masses.  MUSCULOSKELETAL:  She has good range of motion in bilateral upper and  lower extremities.  NECK:  No bruits, JVD.  LUNGS:  Respirations regular and unlabored.  Clear to auscultation.  CARDIAC:  Regular S1, S2, no S3, S4 or murmurs.  ABDOMEN:  Round, soft, nontender and nondistended.  Bowel sounds  present.  EXTREMITIES:  Warm, dry, pink, no clubbing, cyanosis, edema or  petechiae.  Distal pulses are 2+ and equal bilaterally.   LABORATORY WORK:  Hemoglobin 12.7, hematocrit 35.2, WBC 7.0., platelets  157.  Lab work from outside hospital on November 25, 2008, sodium 138,  potassium 3.7, chloride 106, CO2 21.7, BUN 14, creatinine 0.6, glucose  116.  LFTs were within normal limits.  Cardiac markers were negative x1.  ECG today after her event shows sinus rhythm at a rate of 66, left  atrial enlargement.  No acute ST-T changes.  She has a normal PR  interval.   ASSESSMENT AND PLAN:  Syncope/sick sinus syndrome, history of  lightheadedness and syncope most recently resulting in head and facial  trauma.  She has witnessed recurrent syncope in the setting of sinus  arrest and impressive pauses.  She is now in sinus rhythm.  No  complaints currently.  She has a Zoll at the bedside that is connected  with the pads in place.  Atropine is also at the bedside.  Obtain a stat  BMET, magnesium, and cardiac markers.  We will also check TSH and 2-D  echocardiogram.  Electrophysiology evaluation in the a.m. with probable  pacer tomorrow.  If cardiac markers are abnormal or LV function  abnormal, we will  have to consider further ischemic testing as well.  If  she has recurrent or progressive pauses today, will will consider a  temporary wire for transvenous pacing.  Nicolasa Ducking, ANP      Jonelle Sidle, MD  Electronically Signed    CB/MEDQ  D:  11/26/2008  T:  11/27/2008  Job:  [id number]

## 2011-05-13 NOTE — Consult Note (Signed)
[name], [name]             ACCOUNT NO.:  [id number]   MEDICAL RECORD NO.:  [id number]          PATIENT TYPE:  INP   LOCATION:  3102                         FACILITY:  [facility]   PHYSICIAN:  Cristi Loron, M.D.DATE OF BIRTH:  26-Jul-1935   DATE OF CONSULTATION:  11/25/2008  DATE OF DISCHARGE:                                 CONSULTATION   CHIEF COMPLAINT:  Fall.   HISTORY OF PRESENT ILLNESS:  The patient is a 76 year old white female  who at approximately 0700 this morning was in the kitchen by her self.  Her husband was in a nearby room.  He heard a thud and saw his wife  lying unconscious on the floor.  He also mentioned that she was  unconscious for less than a minute.  He called the EMS and the patient  was taken [facility] where she was evaluated with  multiple imaging studies.  The patient lives in [place], and the  patient was transferred to [facility] for further care to the  Trauma Service.  A neurosurgical consultation was requested.   Presently, the patient is a bit somnolent but arousable.  Most of the  information is obtained via her husband who is present with her during  the entire history and physical.  The patient admits to some mild neck  stiffness.  She has no numbness or tingling.  There is no history of  seizures, nausea, or vomiting.   PAST MEDICAL HISTORY:  Positive for osteoporosis and breast cancer with  no known metastasis.   PAST SURGICAL HISTORY:  Mastectomy for breast cancer.   MEDICATIONS PRIOR TO ADMISSION:  Boniva monthly.   No known drug allergies.   FAMILY MEDICAL HISTORY:  Noncontributory.   SOCIAL HISTORY:  The patient is married.  She has 2 children.  She lives  in [place].  She is retired.  She denies tobacco or drug use, rarely  drinks alcohol.   REVIEW OF SYSTEMS:  Negative except as above.  She denies neck pain or  back pain.   PHYSICAL EXAMINATION:  GENERAL:  A mildly somnolent but  arousable  traumatized 76 year old white female.  HEENT:  The patient has bilateral periorbital ecchymosis.  There is no  Battle sign.  No evidence of CSF otorrhea or rhinorrhea.  Her pupils are  equal, round, and reactive to light.  Extraocular muscles are intact.  Oropharynx benign.  NECK:  Supple.  There is no masses, deformities, tracheal deviation, or  jugular vein distention.  She has a mildly limited cervical range of  motion.  Spurling testing is negative.  Lhermitte sign is not present.  THORAX:  Symmetric.  ABDOMEN:  Soft.  HEART:  Regular rhythm.  EXTREMITIES:  No obvious deformities.  BACK:  Normal.  No point tenderness or deformities.  NEUROLOGIC:  The patient's Glasgow coma scale is 13 (E3, M6, V4).  She  is somnolent, but easily arousable.  She is oriented to person and [facility].  She could not tell me the month or year.  Cranial nerves  II through XII examined bilaterally and  are grossly normal.  Vision and  hearing grossly normal bilaterally.  Motor strength is 5/5 in bilateral  biceps, triceps, hand grip, psoas, quadriceps, gastrocnemius, and  extensor hallucis longus.  Sensory function is intact to light touch,  sensation in all tested dermatomes bilaterally.  Cerebellar function is  intact to rapid alternating movements of the upper extremities  bilaterally.  Deep tendon reflexes are 2/4 in bilateral biceps, triceps,  brachioradialis, quadriceps, and gastrocnemius.  There is no ankle  clonus.   IMAGING STUDIES:  I have reviewed the patient's cranial CT scan  performed without contrast on November 25, 2008, at [facility]  [facility] demonstrates the patient has a small right subdural  hematoma without significant mass effect.  She has bilateral  subarachnoid hemorrhages in the sylvian fissure.  There is no blood in  the patient's cisterns.  The patient has left orbital and left frontal  skull fracture.  She has fluid in her ethmoid and  sphenoid sinuses.  __________was performed on November 25, 2008, was reviewed with the  patient's husband.  AP and lateral L-spine x-ray performed at [facility] on November 25, 2008, demonstrates the patient  has disk degeneration, most prominent at L4-5, but no acute findings.   Also, the patient's AP and lateral T-spine x-rays performed at [facility] on November 25, 2008, demonstrates no acute  findings.   Also, I reviewed the patient's cervical CT scan performed at [facility] on November 25, 2008, demonstrates the patient  has degenerative changes and spondylosis, most prominent at C4-5, C5-6,  and C6-7.  I do not see abnormal soft tissue swelling.  No evidence of  acute fractures.  She does have some central and foraminal stenosis, but  nothing acute.   ASSESSMENT AND PLAN:  Closed head injury, skull fracture, subarachnoid  hemorrhage, and subdural hematoma.  I discussed the situation with the  patient and her husband.  I have recommended that she be observed in the  ICU.  We will repeat her cranial CT scan without contrast in the morning  to rule out additional bleeding.  I told them I did not think that her  injury would likely require neurosurgical intervention.  I have answered  all his questions and I gave my card.      Cristi Loron, M.D.  Electronically Signed     JDJ/MEDQ  D:  11/25/2008  T:  11/26/2008  Job:  [id number]

## 2011-05-13 NOTE — Assessment & Plan Note (Signed)
HISTORY OF PRESENT ILLNESS:  [name] returns to clinic today  accompanied by her husband.  The patient is a 76 year old female who is  visiting the [place] when she fell at her kitchen at  her home there.  That occurred in November 25, 2008.  She is taken to an  outside hospital where CT of the brain showed scattered subarachnoid  hemorrhage with hemorrhagic contusions of the right temporal lobe and  nondisplaced fracture of the left frontal bone as well as left orbit and  left zygomatic arch.  She is transferred to [facility] on the  same day for ongoing treatment.  CT of the cervical spine showed  degenerative changes and spondylosis C4 through C7.  She was evaluated  by Trauma and Dr. Lovell Sheehan from Neurosurgery.  Dr. Lovell Sheehan recommended  conservative care.  There were episodes of asystole on November 26, 2008  and bradycardia.  The [facility] was consulted for input, and  the patient was thought to have sick sinus syndrome and a permanent  pacemaker was placed.  Transesophageal echocardiogram showed an ejection  fraction of 60% with mild-to-moderate pulmonary valve regurgitation.  A  permanent pacemaker was placed on November 27, 2008.  The patient was  moved to the rehabilitation November 30, 2008 and remained there through  discharge December 08, 2008.  She has finished all home therapy and was  released at this point.  She did see Dr. Catha Gosselin, her primary care  physician and they had not made any changes.  She did follow up with Dr.  Lovell Sheehan and a followup CAT scan reported was unremarkable.  She expects  to follow up with Dr. Lovell Sheehan.  She continues on her Tegretol at the  present time.  She did see Dr. Elmer Picker, an ophthalmologist for evaluation  of artificial lens in her left eye.  No changes were made at that time.   MEDICATIONS:  1. Tegretol 100 mg b.i.d.  2. Multivitamin daily.  3. Senokot-S daily.  4. Ultram 50 mg p.r.n.  5. Tylenol  p.r.n.  6. Sodium chloride 1 tablet b.i.d.   REVIEW OF SYSTEMS:  Positive for constipation.   The patient denies much pain at the present time   PHYSICAL EXAMINATION:  GENERAL:  Well appearing, thin, adult female  seated in a regular chair in no acute discomfort.  VITAL SIGNS:  Blood pressure 105/64 with pulse of 90, respiratory rate  18 and O2 saturation 97% on room air.  EXTREMITIES:  She has 4-4+/5 strength throughout the bilateral upper and  lower extremities.  Bulk was normal for her, as she is a small frame  person.  Sensation was intact to light touch throughout the bilateral  upper and lower extremities.   IMPRESSION:  1. Traumatic brain injury with right frontal subarachnoid hemorrhage,      status post fall in late November 2009.  2. Hyponatremia, presently maintained on sodium chloride twice a day.  3. Sick sinus syndrome with permanent pacemaker.  4. Seizure prophylaxis on the Tegretol b.i.d.   In the office today, we did have the patient remember to ask Dr. Lovell Sheehan  about weaning the Tegretol.  It appears that it could be decreased by 50  mg every week, but we will leave that up to Dr. Lovell Sheehan when he follows  up with the patient.  I have also asked them to follow up with her  primary care physician for repeat blood work to see if the  sodium chloride could be discontinued.  She has finished all therapy at  this time, but we will plan on seeing her for one final visit in  approximately 2 months' time.           ______________________________  Ellwood Dense, M.D.     DC/MedQ  D:  01/17/2009 09:56:08  T:  01/17/2009 22:12:57  Job #:  [id number]

## 2011-05-13 NOTE — Discharge Summary (Signed)
[name], [name]             ACCOUNT NO.:  [id number]   MEDICAL RECORD NO.:  [id number]          PATIENT TYPE:  IPS   LOCATION:  4033                         FACILITY:  [facility]   PHYSICIAN:  Ellwood Dense, M.D.   DATE OF BIRTH:  [birth year]   DATE OF ADMISSION:  11/30/2008  DATE OF DISCHARGE:  12/08/2008                               DISCHARGE SUMMARY   DISCHARGE DIAGNOSES:  1. Traumatic brain injury with a right frontal subarachnoid      hemorrhage.  2. Sick sinus syndrome with permanent pacemaker placement.  3. Hyponatremia, improved.  4. Leukopenia, question due to Tegretol.   HISTORY OF PRESENT ILLNESS:  [name] is  a 76 year old female who  [place] she sustained fall in her kitchen on  November 25, 2008.  She was taken to outside hospital where CT of head  done revealed scattered subarachnoid hemorrhage, hemorrhagic contusions  right temporal lobe, nondisplaced fracture of left frontal bone as well  as left orbit and left zygomatic arch.  The patient was transferred to  [facility] the same day for followup and treatment.  CT of the  cervical spine showed degenerative changes, spondylosis C4-C7.  She was  evaluated by trauma and neurosurgery.  Dr. Lovell Sheehan recommended  conservative management with serial CTs.  Dr. __________ evaluated the  patient and recommended followup past discharge and also planned  intervention needed currently.  The patient was noted to have an episode  of asystole on November 26, 2008 with bradycardia.  [facility]  was consulted for input and felt the patient with sick sinus syndrome  and permanent pacemaker was recommended.  TEE showed EF of 60% with mild-  to-moderate PVR.  A pacemaker was placed on November 27, 2008 with the  patient having some issues with confusion __________as well as continued  issues with headaches.  She is noted to have issues with hypokalemia  additionally with last potassium  levels of 4.0.  She was also noted to  have issues with hyponatremia with sodium at 133.  Currently, the  patient is noted to have impairments in mobility.  She is diagnosed to  have issues with confusion as well as problems with memory and dizziness  with mobility.  Due to impairments in mobility and self-care, rehab was  consulted for further therapy.   PAST MEDICAL HISTORY:  1. Significant for COPD with fibrosis.  2. Right mastectomy with reconstruction and removal of prosthesis.  3. History of syncope.  4. Hysterectomy.  5. Diverticulosis.  6. History of left foot fracture.  7. Appendectomy.  8. C. section.   ALLERGIES:  1. DARVOCET.  2. AMOXICILLIN.   FAMILY HISTORY:  Positive for coronary artery disease.   SOCIAL HISTORY:  The patient is married, lives in a one level home with  no steps at entry.  Does not use any tobacco use.  Alcohol occasionally.   FUNCTIONAL HISTORY:  The patient was independent and driving prior to  admission.  Functional status:  The patient is min assist for bed  mobility and transfers; mod assist ambulating 3-4 steps with  increased  levels of anxiety reported.  She is also noted to be internally  distracted with speech therapy evaluation.  Also reports patient  requiring mod assist of body care with lethargy being a limiting factor.   PHYSICAL EXAMINATION:  GENERAL:  The patient is reasonably well-  appearing elderly small adult female in no acute distress.  She is noted  to have resolving periorbital ecchymosis on left orbit  HEENT:  Normocephalic with some bruising in the left face and left eye.  Extraocular movements are intact.  Pupils equal, round and reactive to  light.  Nares patent.  Moist oral mucosa.  NECK:  Supple without masses or JVD.  LUNGS:  Clear to auscultation bilaterally without wheezes or rales.  HEART:  Shows occasional PVCs.  No murmurs.  ABDOMEN:  Soft, nontender with positive bowel sounds.  EXTREMITIES:  Showed no  evidence of edema or cyanosis.  NEUROLOGIC:  The patient is alert and oriented x3.  Sensation is intact  to light touch throughout.  Bulk and tone are normal.  She has 4+/5  strength to bilateral upper and lower extremities.  Cranial nerve exam  is unremarkable with good eye close and good facial asymmetry.  Tongue  in midline.  Lower extremity pulse is decreased.  Tone is normal.  She  is noted to have problems with high-level memory and processing as well  as some perseveration.  She is able to follow basic commands without  difficulty.  SKIN:  Shows pad and dressing in the upper chest with ecchymosis around  left inner arm and axilla area due to resolving hematoma.   HOSPITAL COURSE:  [name] is admitted to rehab on November 30, 2008 for inpatient therapies to consist of PT, OT and speech therapy  __________.  Past admission to psychiatrist, rehab RN and therapy team  have worked together to provide customized collaborative  interdisciplinary care throughout her stay.  Weekly team consults were  held to set goals and discuss the patient's progress as well as barriers  to discharge.  Initially the patient was noted to have issues with  dizziness with easy distractibility.  Rehab RN has been assisting with  pushing the patient's p.o. intake to help for her hydration status.  The  patient has also been toileted q.4 h. to maintain bowel and bladder  continence with training.  Wound care of left upper chest wall has been  monitored closely.  Hematoma is noted to be resolving with the patient  being clean and intact.  __________ has also been assisting with pain  management.  We have been working with therapy team with pre-medicating  the patient prior to therapies to help improve her participation and  tolerance of therapies.  Initially the patient was noted to have issues  with dizziness.  An abdominal binder was ordered to help with  orthostatic symptoms.   LABORATORY DATA:   Labs were done at admission revealing hemoglobin 13.1,  hematocrit 37.2, white count 6.0 and platelets 182.  CBC revealed  sodium 132, potassium 3.9, chloride 96, CO2 26, BUN 10, creatinine 0.5,  glucose 107.  LFTs revealed alkaline phosphatase 37.  AST 18, ALT 21,  total protein 6.1, albumin 3.6,  calcium 9.1.  The patient was started  on Tegretol prior admission for seizure prophylaxis.  She was maintained  on this throughout her stay.  Tegretol levels prior to discharge is  therapeutic at 6.3.  A repeat CBC was also done prior to discharge  to  monitor her white count.  She is noted to have some drop in her white  count at 3.6.  Repeat CBC is ordered for December 14, 2008 with results  to be monitored by the primary MD.  The patient's hyponatremia has also  been monitored along closely.  Recheck of December 04, 2008 showed  improvement at 135.  Last recheck on December 07, 2008 shows a slight  drop with sodium at 134, electrolytes otherwise within normal limits.   The patient has had no issues with lethargy or confusion during this  stay.  Initially the patient was also limited by pacer precautions with  limitation in range of motion as well as weightbearing.  She was  released from restrictions in 7 days at which time OT could work on for  the range of motion and progressive self-care needs.  The patient's  blood pressures were monitored on b.i.d. basis during this stay.  Initially the patient was noted to have issues with hypertension.  These  have resolved with blood pressures ranging from 110-120 systolics at  time of discharge.  Heart rate has been stable in 73 range.  The patient  has been afebrile.  She has been continent of bowel and bladder.  At the  time of admission, OT evaluation noted the patient requiring verbal cues  as well as min assist to maintain her pacer precautions.  She is also  noted to have issues with dizziness and abdominal binder ordered has  helped with her  symptomatology.  PT, OT evaluation has also noted the  patient with decrease in endurance level and therapies were spread out  to help with better participation in therapy.  OT has been working with  the patient to address activity tolerance balance as well as selective  attention and sequencing during self-care tasks.  She was noted to  require increased time for basic self-care due to decrease in attention  and increase in distractibility.  At time of discharge, the patient was  at min assist to supervision level for self-care and was able to  showering with 2 cues and min cues of organization.  Family education  was done with ADL gait retraining on shower focusing on safety as well  as assisting the patient to complete tasks.  PT evaluation revealed the  patient had min assist for standing 30 seconds.  Close supervision for  static standing balance.  She was able to ambulate up to 200 feet from  handheld assist to min assist then due to fatigue.  PT has been working  with the patient for endurance as well as mobility.  Currently the  patient has progressed along to being at supervision for transfers,  supervision for ambulating throughout the unit, able to navigate 12  stairs with a rolling walker with close supervision using bilateral  rails.  Family education was completed with husband to emphasize need  for supervision as well as need for supervision for car transfers and  stair navigation.  The patient does continue to demonstrate difficulty  remembering direction from room to therapy gym.  She is able to look  around to orient herself requiring decrease in verbal cues.  Speech  evaluation showed the patient presenting with cognitive deficit in areas  of short-term memory storage and verbal decrease in processing for  functional nd verbal problem-solving problems with reasoning with poor  alternating attention with the distraction and decreased endurance for  cognitive tasks.  The  patient's auditory comprehension  deficits were  noted to be in the area of complex and paragraph level comprehension  with poor focus on conversational topics with decrease in social  communication evidenced by decrease in engaging as well as problems  monitoring conversation.  Speech therapy has been working with the  patient for comprehension, reading and writing expression as well as  high-level cognitive tasks.  Currently, the patient is able to answer  yes/no questions with 97% accuracy with min cues.  She is at min assist  for conversation in group setting.  Speech is __________intelligible.  She is able to match words to pictures and sentences within functional  limits.  Requires min cues for comprehension of paragraph.  She is able  to write without difficulty.  She requires min cues for selective  attention in a small group setting with mod assist for functional tasks.  She continues with poor awareness of deficits as well as decrease in  short-term memory.  She requires mod assist for recall of daily  therapeutic and medical intervention.  She continues with some mild  delay in executive functioning with slow processing and decrease in self  monitoring during complex reasoning and problem solving.  A 24-hour  supervision is recommended past discharge and the patient's husband is  to provide this.  The patient will continue with further follow up home  health PT, OT, speech therapy and RN by [facility]  past discharge.  The patient to transition to outpatient therapy in the  next few weeks.  On December 08, 2008, the patient is discharged to  home.   DISCHARGE MEDICATIONS:  1. Tegretol 100 mg p.o. b.i.d.  2. Multivitamin 1 p.o. per day.  3. Senokot-S 2 p.o. q.a.m. and p.m.  4. Ultram 50 mg q.6-8 h. p.r.n. pain.  5. Tylenol 325 mg one-two q.6-8 h. p.r.n. pain.  6. Sodium chloride tabs 1 p.o. b.i.d.   DIET:  Regular.   ACTIVITY:  Activity level is at  24-hour supervision.  No alcohol, no  smoking, no driving, no strenuous activity.   SPECIAL INSTRUCTIONS:  [facility] to provide PT, OT, speech  therapy and RN.  Do not use aspirin or aspirin-containing products.   FOLLOW UP:  1. The patient will follow up with Dr. Thomasena Edis on January 15, 2009 at      2:40.  2. Follow up with Dr. Frederico Hamman on February 02, 2009 at 2:15 p.m.  3. Follow up with _________[facility]inic on December 13, 2008 at 9:20 a.m.  4. Follow up with Dr. Delma Officer in 2-3 weeks.  5. Follow up with Dr. Catha Gosselin for routine check in 2 weeks.      Greg Cutter, P.A.    ______________________________  Ellwood Dense, M.D.    PP/MEDQ  D:  12/12/2008  T:  12/12/2008  Job:  [id number]   cc:   Cristi Loron, M.D.  Anna Genre Little, M.D.  Jonelle Sidle, MD

## 2011-05-16 NOTE — Op Note (Signed)
[name], [name]             ACCOUNT NO.:  [id number]   MEDICAL RECORD NO.:  [id number]          PATIENT TYPE:  AMB   LOCATION:  ENDO                         [facility]ACILITY[facility]   PHYSICIAN:  Sandria Bales. Ezzard Standing, M.D.  DATE OF BIRTH:  1935/04/20   DATE OF PROCEDURE:  06/19/2005  DATE OF DISCHARGE:                                 OPERATIVE REPORT   PREOPERATIVE DIAGNOSES:  1.  History of breast cancer.  2.  Prior history of colon injury during hysterectomy.   POSTOPERATIVE DIAGNOSIS:  Very redundant sigmoid and transverse colon with  sigmoid diverticulosis.   PROCEDURE:  Flexible colonoscopy.   SURGEON:  Sandria Bales. Ezzard Standing, M.D.   ANESTHESIA:  50 mg of Demerol, 3 mg of Versed.   COMPLICATIONS:  None.   INDICATIONS FOR SURGERY:  [name] is a 76 year old white female who  had a T-2 N-0 lobular carcinoma of the right breast treated in November  1996, so she is 10 years out from her breast cancer. She had her last  screening colonoscopy 5 years ago and comes back for repeat colonoscopy.   The indications and potential complications of the procedure were explained  to the patient.  The potential complications include perforation of the  bowel and bleeding from the colon.   OPERATIVE NOTE:  The patient completed a half Lytely bowel prep at home. She  had an IV in her left arm.  She was measured with telemetry, had a blood  pressure cuff on, and pulse oximetry and was given O2 during the procedure.  The patient was placed in the left lateral decubitus position.   A flexible pediatric Olympus endoscope was passed through her colon.  It was  noted first that her sigmoid colon was fairly redundant and actually  tethered from her prior hysterectomy.  It took a fair amount of time to get  through the sigmoid colon. Her transverse colon actually was so redundant  that it starts up in the left upper quadrant and goes down to the pubic bone  and then back up to the right upper  quadrant.  Because she is so thin I  could actually transilluminate her bowel during most of the procedure.   I was able to get into the right colon, visualize the ileocecal valve, and  the light in the right lower quadrant using the entire scope. She tolerated  advancing the scope fairly well.  I visualized well the right colon, a very  redundant transverse colon.  The left colon was unremarkable for any polyps  or lesions.  Her sigmoid colon, and maybe distal left colon, does have some  diverticular disease, but she has no polyps or growths within the rectum.  There are no visual or palpable rectal mass.   The patient tolerated the colonoscopy well.  Again, she has the diverticular  disease of her sigmoid colon.  I think, that her age you can argue her next  colonoscopy should be in 5-10 years, again, depending on her physical  condition at that time.       DHN/MEDQ  D:  06/19/2005  T:  06/19/2005  Job:  [id number]   cc:   [name], M.D.  [address]  [place]  [place] [address]  Fax: [phone]   [name], M.D.  [address] [address]  [place]  [place] [address]  Fax: [phone]

## 2011-06-11 ENCOUNTER — Encounter: Payer: Self-pay | Admitting: Internal Medicine

## 2011-06-11 DIAGNOSIS — I495 Sick sinus syndrome: Secondary | ICD-10-CM

## 2011-09-10 ENCOUNTER — Encounter: Payer: Self-pay | Admitting: Internal Medicine

## 2011-09-10 DIAGNOSIS — I495 Sick sinus syndrome: Secondary | ICD-10-CM

## 2011-09-30 LAB — URINALYSIS, ROUTINE W REFLEX MICROSCOPIC
Hgb urine dipstick: NEGATIVE
Nitrite: NEGATIVE
Urobilinogen, UA: 1 mg/dL (ref 0.0–1.0)

## 2011-09-30 LAB — URINE CULTURE
Colony Count: NO GROWTH
Culture: NO GROWTH

## 2011-09-30 LAB — CBC
Hemoglobin: 12.7 g/dL (ref 12.0–15.0)
MCHC: 36 g/dL (ref 30.0–36.0)
Platelets: 157 10*3/uL (ref 150–400)
RDW: 12.9 % (ref 11.5–15.5)
WBC: 7 10*3/uL (ref 4.0–10.5)

## 2011-09-30 LAB — CARDIAC PANEL(CRET KIN+CKTOT+MB+TROPI)
Relative Index: INVALID (ref 0.0–2.5)
Relative Index: INVALID (ref 0.0–2.5)
Total CK: 92 U/L (ref 7–177)

## 2011-09-30 LAB — BASIC METABOLIC PANEL
BUN: 11 mg/dL (ref 6–23)
CO2: 26 mEq/L (ref 19–32)
Chloride: 103 mEq/L (ref 96–112)
GFR calc Af Amer: >60 mL/min (ref >60)
Glucose, Bld: 137 mg/dL — ABNORMAL HIGH (ref 70–99)

## 2011-09-30 LAB — TSH: TSH: 0.775 u[IU]/mL (ref 0.350–4.500)

## 2011-09-30 LAB — DIFFERENTIAL: Neutro Abs: 5.8 10*3/uL (ref 1.7–7.7)

## 2011-10-03 LAB — COMPREHENSIVE METABOLIC PANEL
AST: 18 U/L (ref 0–37)
Albumin: 3.2 g/dL — ABNORMAL LOW (ref 3.5–5.2)
CO2: 26 mEq/L (ref 19–32)
Calcium: 9.1 mg/dL (ref 8.4–10.5)
Creatinine, Ser: 0.45 mg/dL (ref 0.4–1.2)
GFR calc Af Amer: >60 mL/min (ref >60)
GFR calc non Af Amer: >60 mL/min (ref >60)

## 2011-10-03 LAB — BASIC METABOLIC PANEL
BUN: 9 mg/dL (ref 6–23)
CO2: 27 mEq/L (ref 19–32)
CO2: 28 mEq/L (ref 19–32)
Calcium: 8.8 mg/dL (ref 8.4–10.5)
Chloride: 100 mEq/L (ref 96–112)
Chloride: 103 mEq/L (ref 96–112)
Chloride: 99 mEq/L (ref 96–112)
Creatinine, Ser: 0.38 mg/dL — ABNORMAL LOW (ref 0.4–1.2)
Creatinine, Ser: 0.4 mg/dL (ref 0.4–1.2)
Creatinine, Ser: 0.44 mg/dL (ref 0.4–1.2)
GFR calc Af Amer: >60 mL/min (ref >60)
GFR calc Af Amer: >60 mL/min (ref >60)
GFR calc Af Amer: >60 mL/min (ref >60)
GFR calc non Af Amer: >60 mL/min (ref >60)
GFR calc non Af Amer: >60 mL/min (ref >60)
Glucose, Bld: 90 mg/dL (ref 70–99)
Potassium: 3.1 mEq/L — ABNORMAL LOW (ref 3.5–5.1)
Potassium: 3.7 mEq/L (ref 3.5–5.1)
Potassium: 3.8 mEq/L (ref 3.5–5.1)
Sodium: 133 mEq/L — ABNORMAL LOW (ref 135–145)
Sodium: 135 mEq/L (ref 135–145)

## 2011-10-03 LAB — CBC
HCT: 35.5 % — ABNORMAL LOW (ref 36.0–46.0)
Hemoglobin: 12.5 g/dL (ref 12.0–15.0)
Hemoglobin: 12.6 g/dL (ref 12.0–15.0)
MCHC: 35.1 g/dL (ref 30.0–36.0)
MCHC: 35.3 g/dL (ref 30.0–36.0)
MCV: 89.8 fL (ref 78.0–100.0)
MCV: 91.8 fL (ref 78.0–100.0)
Platelets: 149 10*3/uL — ABNORMAL LOW (ref 150–400)
Platelets: 182 10*3/uL (ref 150–400)
RBC: 3.93 MIL/uL (ref 3.87–5.11)
RBC: 3.95 MIL/uL (ref 3.87–5.11)
RDW: 13 % (ref 11.5–15.5)
WBC: 5.3 10*3/uL (ref 4.0–10.5)

## 2011-10-03 LAB — DIFFERENTIAL
Basophils Relative: 0 % (ref 0–1)
Eosinophils Absolute: 0.1 10*3/uL (ref 0.0–0.7)
Eosinophils Relative: 3 % (ref 0–5)
Lymphs Abs: 0.4 10*3/uL — ABNORMAL LOW (ref 0.7–4.0)
Monocytes Absolute: 0.5 10*3/uL (ref 0.1–1.0)
Monocytes Relative: 15 % — ABNORMAL HIGH (ref 3–12)
Neutrophils Relative %: 70 % (ref 43–77)

## 2011-12-10 ENCOUNTER — Encounter: Payer: Self-pay | Admitting: Internal Medicine

## 2011-12-10 DIAGNOSIS — I495 Sick sinus syndrome: Secondary | ICD-10-CM

## 2012-02-18 ENCOUNTER — Other Ambulatory Visit: Payer: Self-pay | Admitting: Family Medicine

## 2012-02-18 DIAGNOSIS — R1011 Right upper quadrant pain: Secondary | ICD-10-CM

## 2012-02-18 DIAGNOSIS — R10811 Right upper quadrant abdominal tenderness: Secondary | ICD-10-CM | POA: Diagnosis not present

## 2012-02-18 DIAGNOSIS — R079 Chest pain, unspecified: Secondary | ICD-10-CM | POA: Diagnosis not present

## 2012-02-20 ENCOUNTER — Other Ambulatory Visit: Payer: Medicare Other

## 2012-02-23 ENCOUNTER — Ambulatory Visit
Admission: RE | Admit: 2012-02-23 | Discharge: 2012-02-23 | Disposition: A | Discharge status: Home / Self Care | Payer: Medicare Other | Source: Ambulatory Visit | Attending: Family Medicine | Admitting: Family Medicine

## 2012-02-23 DIAGNOSIS — R1011 Right upper quadrant pain: Secondary | ICD-10-CM

## 2012-02-23 DIAGNOSIS — K824 Cholesterolosis of gallbladder: Secondary | ICD-10-CM | POA: Diagnosis not present

## 2012-02-23 DIAGNOSIS — I700 Atherosclerosis of aorta: Secondary | ICD-10-CM | POA: Diagnosis not present

## 2012-03-10 ENCOUNTER — Encounter: Payer: Self-pay | Admitting: Internal Medicine

## 2012-03-10 DIAGNOSIS — I495 Sick sinus syndrome: Secondary | ICD-10-CM | POA: Diagnosis not present

## 2012-05-10 ENCOUNTER — Encounter: Payer: Self-pay | Admitting: Internal Medicine

## 2012-05-10 ENCOUNTER — Ambulatory Visit (INDEPENDENT_AMBULATORY_CARE_PROVIDER_SITE_OTHER): Payer: Medicare Other | Admitting: Internal Medicine

## 2012-05-10 VITALS — BP 120/60 | HR 62 | Resp 18 | Ht 62.0 in | Wt 84.1 lb

## 2012-05-10 DIAGNOSIS — I495 Sick sinus syndrome: Secondary | ICD-10-CM

## 2012-05-10 LAB — PACEMAKER DEVICE OBSERVATION
AL AMPLITUDE: 1.8 mv
AL IMPEDENCE PM: 402 Ohm
ATRIAL PACING PM: 8.7
VENTRICULAR PACING PM: 1

## 2012-05-10 NOTE — Patient Instructions (Signed)
Your physician wants you to follow-up in: 12 months with Dr Allred You will receive a reminder letter in the mail two months in advance. If you don't receive a letter, please call our office to schedule the follow-up appointment.    Mednet every 3 months  

## 2012-05-10 NOTE — Progress Notes (Signed)
  [name] is a 77 y.o. female who presents today for routine electrophysiology followup.  Since last being seen in our clinic, the patient reports doing very well.  Today, she denies symptoms of palpitations, chest pain, shortness of breath,  lower extremity edema, dizziness, presyncope, or syncope.  The patient is otherwise without complaint today.   Past Medical History  Diagnosis Date  . Sinus node dysfunction     with syncope; status post implantation of a [employer] Zephyr XL model 5826 dual-chamber pacemaker  November 27, 2008.  . Subarachnoid hemorrhage following injury     right frontal, following traumatic brain injury  . Hyponatremia    Past Surgical History  Procedure Date  . Pacemaker insertion 11/27/08    by [facility])    Current Outpatient Prescriptions  Medication Sig Dispense Refill  . Multiple Vitamin (MULITIVITAMIN WITH MINERALS) TABS Take 1 tablet by mouth daily.        Physical Exam: Filed Vitals:   05/10/12 1051  BP: 120/60  Pulse: 62  Resp: 18  Height: 5\' 2"  (1.575 m)  Weight: 84 lb 1.9 oz (38.157 kg)    GEN- The patient is well appearing, alert and oriented x 3 today.   Head- normocephalic, atraumatic Eyes-  Sclera clear, conjunctiva pink, L eye ecchymosis Ears- hearing intact Oropharynx- clear Lungs- Clear to ausculation bilaterally, normal work of breathing Chest- pacemaker pocket is well healed Heart- Regular rate and rhythm, no murmurs, rubs or gallops, PMI not laterally displaced GI- soft, NT, ND, + BS Extremities- no clubbing, cyanosis, or edema  Pacemaker interrogation- reviewed in detail today,  See PACEART report  Assessment and Plan:

## 2012-05-10 NOTE — Assessment & Plan Note (Signed)
Normal pacemaker function See Pace Art report No changes today  

## 2012-05-17 DIAGNOSIS — H40019 Open angle with borderline findings, low risk, unspecified eye: Secondary | ICD-10-CM | POA: Diagnosis not present

## 2012-05-17 DIAGNOSIS — H04129 Dry eye syndrome of unspecified lacrimal gland: Secondary | ICD-10-CM | POA: Diagnosis not present

## 2012-05-20 DIAGNOSIS — M545 Low back pain: Secondary | ICD-10-CM | POA: Diagnosis not present

## 2012-07-12 DIAGNOSIS — H04129 Dry eye syndrome of unspecified lacrimal gland: Secondary | ICD-10-CM | POA: Diagnosis not present

## 2012-07-12 DIAGNOSIS — H4720 Unspecified optic atrophy: Secondary | ICD-10-CM | POA: Diagnosis not present

## 2012-07-12 DIAGNOSIS — H40019 Open angle with borderline findings, low risk, unspecified eye: Secondary | ICD-10-CM | POA: Diagnosis not present

## 2012-07-12 DIAGNOSIS — H353190 Nonexudative age-related macular degeneration, unspecified eye, stage unspecified: Secondary | ICD-10-CM | POA: Diagnosis not present

## 2012-07-12 DIAGNOSIS — H43399 Other vitreous opacities, unspecified eye: Secondary | ICD-10-CM | POA: Diagnosis not present

## 2012-08-03 DIAGNOSIS — R82998 Other abnormal findings in urine: Secondary | ICD-10-CM | POA: Diagnosis not present

## 2012-08-03 DIAGNOSIS — M810 Age-related osteoporosis without current pathological fracture: Secondary | ICD-10-CM | POA: Diagnosis not present

## 2012-08-03 DIAGNOSIS — I609 Nontraumatic subarachnoid hemorrhage, unspecified: Secondary | ICD-10-CM | POA: Diagnosis not present

## 2012-08-03 DIAGNOSIS — C50919 Malignant neoplasm of unspecified site of unspecified female breast: Secondary | ICD-10-CM | POA: Diagnosis not present

## 2012-08-03 DIAGNOSIS — E7800 Pure hypercholesterolemia, unspecified: Secondary | ICD-10-CM | POA: Diagnosis not present

## 2012-08-03 DIAGNOSIS — Z79899 Other long term (current) drug therapy: Secondary | ICD-10-CM | POA: Diagnosis not present

## 2012-08-03 DIAGNOSIS — Z0000 Encounter for general adult medical examination without abnormal findings: Secondary | ICD-10-CM | POA: Diagnosis not present

## 2012-08-04 DIAGNOSIS — Z0000 Encounter for general adult medical examination without abnormal findings: Secondary | ICD-10-CM | POA: Diagnosis not present

## 2012-08-04 DIAGNOSIS — Z79899 Other long term (current) drug therapy: Secondary | ICD-10-CM | POA: Diagnosis not present

## 2012-08-04 DIAGNOSIS — C50919 Malignant neoplasm of unspecified site of unspecified female breast: Secondary | ICD-10-CM | POA: Diagnosis not present

## 2012-08-04 DIAGNOSIS — E7800 Pure hypercholesterolemia, unspecified: Secondary | ICD-10-CM | POA: Diagnosis not present

## 2012-08-04 DIAGNOSIS — R82998 Other abnormal findings in urine: Secondary | ICD-10-CM | POA: Diagnosis not present

## 2012-08-04 DIAGNOSIS — M810 Age-related osteoporosis without current pathological fracture: Secondary | ICD-10-CM | POA: Diagnosis not present

## 2012-08-04 DIAGNOSIS — I609 Nontraumatic subarachnoid hemorrhage, unspecified: Secondary | ICD-10-CM | POA: Diagnosis not present

## 2012-09-08 DIAGNOSIS — I495 Sick sinus syndrome: Secondary | ICD-10-CM | POA: Diagnosis not present

## 2012-10-15 DIAGNOSIS — Z8262 Family history of osteoporosis: Secondary | ICD-10-CM | POA: Diagnosis not present

## 2012-10-15 DIAGNOSIS — M810 Age-related osteoporosis without current pathological fracture: Secondary | ICD-10-CM | POA: Diagnosis not present

## 2012-11-01 DIAGNOSIS — Z23 Encounter for immunization: Secondary | ICD-10-CM | POA: Diagnosis not present

## 2012-11-11 DIAGNOSIS — Z1231 Encounter for screening mammogram for malignant neoplasm of breast: Secondary | ICD-10-CM | POA: Diagnosis not present

## 2012-12-08 DIAGNOSIS — I495 Sick sinus syndrome: Secondary | ICD-10-CM | POA: Diagnosis not present

## 2013-01-04 DIAGNOSIS — Z1331 Encounter for screening for depression: Secondary | ICD-10-CM | POA: Diagnosis not present

## 2013-01-04 DIAGNOSIS — M810 Age-related osteoporosis without current pathological fracture: Secondary | ICD-10-CM | POA: Diagnosis not present

## 2013-01-04 DIAGNOSIS — Z79899 Other long term (current) drug therapy: Secondary | ICD-10-CM | POA: Diagnosis not present

## 2013-01-04 DIAGNOSIS — R05 Cough: Secondary | ICD-10-CM | POA: Diagnosis not present

## 2013-01-17 ENCOUNTER — Other Ambulatory Visit (HOSPITAL_COMMUNITY): Payer: Self-pay | Admitting: Family Medicine

## 2013-01-19 ENCOUNTER — Ambulatory Visit (HOSPITAL_COMMUNITY)
Admission: RE | Admit: 2013-01-19 | Discharge: 2013-01-19 | Disposition: A | Discharge status: Home / Self Care | Payer: Medicare Other | Source: Ambulatory Visit | Attending: Family Medicine | Admitting: Family Medicine

## 2013-01-19 ENCOUNTER — Encounter (HOSPITAL_COMMUNITY): Payer: Self-pay

## 2013-01-19 DIAGNOSIS — E871 Hypo-osmolality and hyponatremia: Secondary | ICD-10-CM | POA: Insufficient documentation

## 2013-01-19 HISTORY — DX: Family history of other specified conditions: Z84.89

## 2013-01-19 MED ORDER — ZOLEDRONIC ACID 5 MG/100ML IV SOLN
5.0000 mg | Freq: Once | INTRAVENOUS | Status: AC
  Administered 2013-01-19: 5 mg via INTRAVENOUS
  Filled 2013-01-19: qty 100

## 2013-01-19 MED ORDER — SODIUM CHLORIDE 0.9 % IV SOLN
Freq: Once | INTRAVENOUS | Status: AC
  Administered 2013-01-19: 10:00:00 via INTRAVENOUS

## 2013-03-09 DIAGNOSIS — I495 Sick sinus syndrome: Secondary | ICD-10-CM | POA: Diagnosis not present

## 2013-04-02 DIAGNOSIS — M79609 Pain in unspecified limb: Secondary | ICD-10-CM | POA: Diagnosis not present

## 2013-04-02 DIAGNOSIS — M25559 Pain in unspecified hip: Secondary | ICD-10-CM | POA: Diagnosis not present

## 2013-04-21 ENCOUNTER — Other Ambulatory Visit: Payer: Self-pay | Admitting: Physician Assistant

## 2013-04-21 DIAGNOSIS — M79605 Pain in left leg: Secondary | ICD-10-CM

## 2013-04-21 DIAGNOSIS — M25569 Pain in unspecified knee: Secondary | ICD-10-CM | POA: Diagnosis not present

## 2013-04-21 DIAGNOSIS — R609 Edema, unspecified: Secondary | ICD-10-CM

## 2013-04-25 ENCOUNTER — Ambulatory Visit
Admission: RE | Admit: 2013-04-25 | Discharge: 2013-04-25 | Disposition: A | Discharge status: Home / Self Care | Payer: Medicare Other | Source: Ambulatory Visit | Attending: Physician Assistant | Admitting: Physician Assistant

## 2013-04-25 DIAGNOSIS — M7989 Other specified soft tissue disorders: Secondary | ICD-10-CM | POA: Diagnosis not present

## 2013-04-25 DIAGNOSIS — R609 Edema, unspecified: Secondary | ICD-10-CM

## 2013-04-25 DIAGNOSIS — M79609 Pain in unspecified limb: Secondary | ICD-10-CM | POA: Diagnosis not present

## 2013-04-25 DIAGNOSIS — M79605 Pain in left leg: Secondary | ICD-10-CM

## 2013-06-08 ENCOUNTER — Encounter: Payer: Self-pay | Admitting: Internal Medicine

## 2013-06-08 DIAGNOSIS — I495 Sick sinus syndrome: Secondary | ICD-10-CM | POA: Diagnosis not present

## 2013-06-08 DIAGNOSIS — Z950 Presence of cardiac pacemaker: Secondary | ICD-10-CM | POA: Diagnosis not present

## 2013-07-26 ENCOUNTER — Encounter: Payer: Self-pay | Admitting: *Deleted

## 2013-09-07 ENCOUNTER — Encounter: Payer: Self-pay | Admitting: Internal Medicine

## 2013-09-07 DIAGNOSIS — I495 Sick sinus syndrome: Secondary | ICD-10-CM

## 2013-09-07 DIAGNOSIS — Z950 Presence of cardiac pacemaker: Secondary | ICD-10-CM | POA: Diagnosis not present

## 2013-09-08 DIAGNOSIS — H43399 Other vitreous opacities, unspecified eye: Secondary | ICD-10-CM | POA: Diagnosis not present

## 2013-09-08 DIAGNOSIS — H43819 Vitreous degeneration, unspecified eye: Secondary | ICD-10-CM | POA: Diagnosis not present

## 2013-09-08 DIAGNOSIS — Z961 Presence of intraocular lens: Secondary | ICD-10-CM | POA: Diagnosis not present

## 2013-09-08 DIAGNOSIS — H353190 Nonexudative age-related macular degeneration, unspecified eye, stage unspecified: Secondary | ICD-10-CM | POA: Diagnosis not present

## 2013-09-08 DIAGNOSIS — H40019 Open angle with borderline findings, low risk, unspecified eye: Secondary | ICD-10-CM | POA: Diagnosis not present

## 2013-09-08 DIAGNOSIS — H26499 Other secondary cataract, unspecified eye: Secondary | ICD-10-CM | POA: Diagnosis not present

## 2013-09-08 DIAGNOSIS — H04129 Dry eye syndrome of unspecified lacrimal gland: Secondary | ICD-10-CM | POA: Diagnosis not present

## 2013-09-14 ENCOUNTER — Encounter: Payer: Medicare Other | Admitting: Internal Medicine

## 2013-09-14 DIAGNOSIS — Z23 Encounter for immunization: Secondary | ICD-10-CM | POA: Diagnosis not present

## 2013-09-15 ENCOUNTER — Encounter: Payer: Self-pay | Admitting: Internal Medicine

## 2013-12-07 ENCOUNTER — Encounter: Payer: Self-pay | Admitting: Internal Medicine

## 2013-12-07 DIAGNOSIS — I495 Sick sinus syndrome: Secondary | ICD-10-CM | POA: Diagnosis not present

## 2013-12-12 ENCOUNTER — Encounter: Payer: Self-pay | Admitting: Internal Medicine

## 2013-12-12 ENCOUNTER — Ambulatory Visit (INDEPENDENT_AMBULATORY_CARE_PROVIDER_SITE_OTHER): Payer: Medicare Other | Admitting: Internal Medicine

## 2013-12-12 VITALS — BP 140/72 | HR 78 | Ht 61.0 in | Wt 91.0 lb

## 2013-12-12 DIAGNOSIS — I495 Sick sinus syndrome: Secondary | ICD-10-CM | POA: Diagnosis not present

## 2013-12-12 DIAGNOSIS — Z87820 Personal history of traumatic brain injury: Secondary | ICD-10-CM | POA: Diagnosis not present

## 2013-12-12 DIAGNOSIS — Z950 Presence of cardiac pacemaker: Secondary | ICD-10-CM | POA: Diagnosis not present

## 2013-12-12 DIAGNOSIS — I4891 Unspecified atrial fibrillation: Secondary | ICD-10-CM | POA: Diagnosis not present

## 2013-12-12 LAB — MDC_IDC_ENUM_SESS_TYPE_INCLINIC
Battery Voltage: 2.79 V
Implantable Pulse Generator Model: 5826
Lead Channel Impedance Value: 402 Ohm
Lead Channel Pacing Threshold Amplitude: 0.375 V
Lead Channel Pacing Threshold Amplitude: 0.5 V
Lead Channel Sensing Intrinsic Amplitude: 2.4 mV
Lead Channel Sensing Intrinsic Amplitude: 4.5 mV
Lead Channel Setting Pacing Amplitude: 2 V
Lead Channel Setting Sensing Sensitivity: 2 mV

## 2013-12-12 NOTE — Progress Notes (Signed)
[name] is a 78 y.o. female who presents today for routine electrophysiology followup.  Since last being seen in our clinic, the patient reports doing very well.  Her husband died in 2023/09/24 rather quickly after having progressive dementia and falls.  Today, she denies symptoms of palpitations, chest pain, shortness of breath,  lower extremity edema, dizziness, presyncope, or syncope.  The patient is otherwise without complaint today.   Past Medical History  Diagnosis Date  . Sinus node dysfunction     with syncope; status post implantation of a [employer] Medical Zephyr XL model 5826 dual-chamber pacemaker  November 27, 2008.  . Subarachnoid hemorrhage following injury     right frontal, following traumatic brain injury  . Hyponatremia   . FH: mastectomy     right side    Past Surgical History  Procedure Laterality Date  . Pacemaker insertion  11/27/08    by Fawn Kirk [facility])    Current Outpatient Prescriptions  Medication Sig Dispense Refill  . Ascorbic Acid (VITAMIN C PO) Take 1 tablet by mouth daily. Pt unsure of dosage      . BETA CAROTENE PO Take 1 tablet by mouth daily. Pt unsure of dosage      . Calcium Carbonate Antacid (ALKA-SELTZER ANTACID PO) Take 1 tablet by mouth as needed.      . cholecalciferol (VITAMIN D) 1000 UNITS tablet Take 1,000 Units by mouth daily.      Marland Kitchen GRAPE SEED CR PO Take 1 tablet by mouth daily. Pt unsure of dosage      . ibuprofen (ADVIL) 200 MG tablet Take 200 mg by mouth every 6 (six) hours as needed.      . Multiple Vitamin (MULITIVITAMIN WITH MINERALS) TABS Take 1 tablet by mouth daily.      . NON FORMULARY Take 1 tablet by mouth daily. Primal defense, pt unsure of dosage      . NON FORMULARY Take 1 tablet by mouth daily. Adrenergize, Pt unsure of dosage      . NON FORMULARY Take 1 tablet by mouth daily. Chilated molybdenum, pt unsure of dosage      . Omega-3 Fatty Acids (FISH OIL PO) Take 1 tablet by mouth daily. Pt unsure of dosage      . VITAMIN  E PO Take 1 tablet by mouth daily. Pt unsure of dosage       No current facility-administered medications for this visit.    Physical Exam: Filed Vitals:   12/12/13 1407  BP: 140/72  Pulse: 78  Height: 5\' 1"  (1.549 m)  Weight: 91 lb (41.277 kg)    GEN- The patient is well appearing, alert and oriented x 3 today.   Head- normocephalic, atraumatic Eyes-  Sclera clear, conjunctiva pink, L eye ecchymosis Ears- hearing intact Oropharynx- clear Lungs- Clear to ausculation bilaterally, normal work of breathing Chest- pacemaker pocket is well healed Heart- Regular rate and rhythm, no murmurs, rubs or gallops, PMI not laterally displaced GI- soft, NT, ND, + BS Extremities- no clubbing, cyanosis, or edema  Pacemaker interrogation- reviewed in detail today,  See PACEART report  Assessment and Plan:  1. Sinus node dysfunction Normal pacemaker function See Pace Art report No changes today  2. afib She has had some afib detected on her PPM interrogation today though her afib burden is low.  We discussed anticoagulation at length today.  Presently, she would prefer to avoid anticoagulation given prior [facility].  She may be more willing to consider anticoagulation if  her afib burden increases.  TTMs every 3 months Follow-up with Lawson Fiscal in 6 months I will see in a year

## 2013-12-12 NOTE — Patient Instructions (Signed)
Your physician recommends that you continue on your current medications as directed. Please refer to the Current Medication list given to you today.  Follow up MedNet in 3 months  Your physician wants you to follow-up in: 6 months with Norma Fredrickson, NP.  You will receive a reminder letter in the mail two months in advance. If you don't receive a letter, please call our office to schedule the follow-up appointment.  Your physician wants you to follow-up in: 1 year with Dr. Johney Frame.  You will receive a reminder letter in the mail two months in advance. If you don't receive a letter, please call our office to schedule the follow-up appointment.

## 2014-01-26 DIAGNOSIS — I609 Nontraumatic subarachnoid hemorrhage, unspecified: Secondary | ICD-10-CM | POA: Diagnosis not present

## 2014-01-26 DIAGNOSIS — Z853 Personal history of malignant neoplasm of breast: Secondary | ICD-10-CM | POA: Diagnosis not present

## 2014-01-26 DIAGNOSIS — Z79899 Other long term (current) drug therapy: Secondary | ICD-10-CM | POA: Diagnosis not present

## 2014-01-26 DIAGNOSIS — Z23 Encounter for immunization: Secondary | ICD-10-CM | POA: Diagnosis not present

## 2014-01-26 DIAGNOSIS — M810 Age-related osteoporosis without current pathological fracture: Secondary | ICD-10-CM | POA: Diagnosis not present

## 2014-02-08 ENCOUNTER — Encounter (HOSPITAL_COMMUNITY): Payer: Medicare Other

## 2014-02-16 ENCOUNTER — Ambulatory Visit (HOSPITAL_COMMUNITY): Admission: RE | Admit: 2014-02-16 | Payer: Medicare Other | Source: Ambulatory Visit

## 2014-02-16 ENCOUNTER — Other Ambulatory Visit (HOSPITAL_COMMUNITY): Payer: Self-pay | Admitting: Family Medicine

## 2014-02-27 DIAGNOSIS — M810 Age-related osteoporosis without current pathological fracture: Secondary | ICD-10-CM | POA: Diagnosis not present

## 2014-02-27 DIAGNOSIS — Z79899 Other long term (current) drug therapy: Secondary | ICD-10-CM | POA: Diagnosis not present

## 2014-03-01 DIAGNOSIS — L259 Unspecified contact dermatitis, unspecified cause: Secondary | ICD-10-CM | POA: Diagnosis not present

## 2014-03-09 ENCOUNTER — Encounter: Payer: Self-pay | Admitting: Internal Medicine

## 2014-03-09 DIAGNOSIS — Z950 Presence of cardiac pacemaker: Secondary | ICD-10-CM | POA: Diagnosis not present

## 2014-03-09 DIAGNOSIS — I495 Sick sinus syndrome: Secondary | ICD-10-CM | POA: Diagnosis not present

## 2014-03-10 DIAGNOSIS — Z111 Encounter for screening for respiratory tuberculosis: Secondary | ICD-10-CM | POA: Diagnosis not present

## 2014-03-10 DIAGNOSIS — M810 Age-related osteoporosis without current pathological fracture: Secondary | ICD-10-CM | POA: Diagnosis not present

## 2014-03-10 DIAGNOSIS — I609 Nontraumatic subarachnoid hemorrhage, unspecified: Secondary | ICD-10-CM | POA: Diagnosis not present

## 2014-03-13 ENCOUNTER — Ambulatory Visit (HOSPITAL_COMMUNITY)
Admission: RE | Admit: 2014-03-13 | Discharge: 2014-03-13 | Disposition: A | Discharge status: Home / Self Care | Payer: Medicare Other | Source: Ambulatory Visit | Attending: Family Medicine | Admitting: Family Medicine

## 2014-03-13 ENCOUNTER — Encounter (HOSPITAL_COMMUNITY): Payer: Self-pay

## 2014-03-13 DIAGNOSIS — M810 Age-related osteoporosis without current pathological fracture: Secondary | ICD-10-CM | POA: Diagnosis not present

## 2014-03-13 MED ORDER — ZOLEDRONIC ACID 5 MG/100ML IV SOLN
5.0000 mg | Freq: Once | INTRAVENOUS | Status: AC
  Administered 2014-03-13: 5 mg via INTRAVENOUS
  Filled 2014-03-13: qty 100

## 2014-03-13 MED ORDER — SODIUM CHLORIDE 0.9 % IV SOLN
Freq: Once | INTRAVENOUS | Status: AC
  Administered 2014-03-13: 13:00:00 via INTRAVENOUS

## 2014-03-13 NOTE — Discharge Instructions (Signed)

## 2014-06-08 ENCOUNTER — Encounter: Payer: Self-pay | Admitting: Internal Medicine

## 2014-06-08 DIAGNOSIS — Z950 Presence of cardiac pacemaker: Secondary | ICD-10-CM | POA: Diagnosis not present

## 2014-06-08 DIAGNOSIS — I495 Sick sinus syndrome: Secondary | ICD-10-CM

## 2014-06-12 ENCOUNTER — Ambulatory Visit: Payer: Medicare Other | Admitting: Nurse Practitioner

## 2014-06-19 ENCOUNTER — Ambulatory Visit: Payer: Medicare Other | Admitting: Nurse Practitioner

## 2014-07-04 ENCOUNTER — Encounter: Payer: Self-pay | Admitting: Internal Medicine

## 2014-07-24 ENCOUNTER — Encounter: Payer: Self-pay | Admitting: Internal Medicine

## 2014-08-02 ENCOUNTER — Ambulatory Visit (INDEPENDENT_AMBULATORY_CARE_PROVIDER_SITE_OTHER): Payer: Medicare Other | Admitting: *Deleted

## 2014-08-02 DIAGNOSIS — I495 Sick sinus syndrome: Secondary | ICD-10-CM

## 2014-08-02 DIAGNOSIS — I4891 Unspecified atrial fibrillation: Secondary | ICD-10-CM

## 2014-08-02 DIAGNOSIS — I480 Paroxysmal atrial fibrillation: Secondary | ICD-10-CM

## 2014-08-02 LAB — MDC_IDC_ENUM_SESS_TYPE_INCLINIC
Battery Impedance: 1000 Ohm — CL
Date Time Interrogation Session: 20150805113014
Implantable Pulse Generator Model: 5826
Implantable Pulse Generator Serial Number: 2228688
Lead Channel Impedance Value: 380 Ohm
Lead Channel Impedance Value: 463 Ohm
Lead Channel Pacing Threshold Amplitude: 0.5 V
Lead Channel Pacing Threshold Pulse Width: 0.4 ms
Lead Channel Pacing Threshold Pulse Width: 0.4 ms
Lead Channel Sensing Intrinsic Amplitude: 1.9 mV
Lead Channel Sensing Intrinsic Amplitude: 8.1 mV
Lead Channel Setting Sensing Sensitivity: 2 mV
MDC IDC MSMT BATTERY VOLTAGE: 2.79 V
MDC IDC MSMT LEADCHNL RV PACING THRESHOLD AMPLITUDE: 0.375 V
MDC IDC SET LEADCHNL RA PACING AMPLITUDE: 2 V
MDC IDC SET LEADCHNL RV PACING PULSEWIDTH: 0.4 ms

## 2014-08-02 NOTE — Progress Notes (Signed)
Pacemaker check in clinic. Normal device function. Thresholds, sensing, impedances consistent with previous measurements. Device programmed to maximize longevity. 462 mode switches (<1%)---max dur. 1 hr 43 mins, Max A 591---no A/C---79 y.o F/M. No htn, DM, or CVA, CHF, PVD. No high ventricular rates noted. Device programmed at appropriate safety margins. Histogram distribution appropriate for patient activity level. Device programmed to optimize intrinsic conduction. Estimated longevity 6.25-9.75 years. Patient will follow up with JA in 6 months.

## 2014-08-14 ENCOUNTER — Encounter: Payer: Self-pay | Admitting: Internal Medicine

## 2014-10-12 DIAGNOSIS — R41841 Cognitive communication deficit: Secondary | ICD-10-CM | POA: Diagnosis not present

## 2014-10-14 DIAGNOSIS — Z23 Encounter for immunization: Secondary | ICD-10-CM | POA: Diagnosis not present

## 2014-10-16 DIAGNOSIS — R41841 Cognitive communication deficit: Secondary | ICD-10-CM | POA: Diagnosis not present

## 2014-10-20 DIAGNOSIS — R41841 Cognitive communication deficit: Secondary | ICD-10-CM | POA: Diagnosis not present

## 2014-10-23 DIAGNOSIS — R41841 Cognitive communication deficit: Secondary | ICD-10-CM | POA: Diagnosis not present

## 2014-10-25 DIAGNOSIS — R41841 Cognitive communication deficit: Secondary | ICD-10-CM | POA: Diagnosis not present

## 2014-10-26 DIAGNOSIS — R41841 Cognitive communication deficit: Secondary | ICD-10-CM | POA: Diagnosis not present

## 2014-10-30 DIAGNOSIS — R41841 Cognitive communication deficit: Secondary | ICD-10-CM | POA: Diagnosis not present

## 2014-11-01 DIAGNOSIS — R41841 Cognitive communication deficit: Secondary | ICD-10-CM | POA: Diagnosis not present

## 2014-11-02 DIAGNOSIS — R41841 Cognitive communication deficit: Secondary | ICD-10-CM | POA: Diagnosis not present

## 2014-11-06 DIAGNOSIS — R41841 Cognitive communication deficit: Secondary | ICD-10-CM | POA: Diagnosis not present

## 2014-11-08 DIAGNOSIS — R41841 Cognitive communication deficit: Secondary | ICD-10-CM | POA: Diagnosis not present

## 2014-11-09 DIAGNOSIS — R41841 Cognitive communication deficit: Secondary | ICD-10-CM | POA: Diagnosis not present

## 2014-11-16 DIAGNOSIS — R41841 Cognitive communication deficit: Secondary | ICD-10-CM | POA: Diagnosis not present

## 2014-11-20 DIAGNOSIS — R41841 Cognitive communication deficit: Secondary | ICD-10-CM | POA: Diagnosis not present

## 2014-11-30 DIAGNOSIS — L298 Other pruritus: Secondary | ICD-10-CM | POA: Diagnosis not present

## 2014-11-30 DIAGNOSIS — L309 Dermatitis, unspecified: Secondary | ICD-10-CM | POA: Diagnosis not present

## 2014-12-01 DIAGNOSIS — I639 Cerebral infarction, unspecified: Secondary | ICD-10-CM | POA: Diagnosis not present

## 2014-12-01 DIAGNOSIS — H8113 Benign paroxysmal vertigo, bilateral: Secondary | ICD-10-CM | POA: Diagnosis not present

## 2014-12-01 DIAGNOSIS — R4182 Altered mental status, unspecified: Secondary | ICD-10-CM | POA: Diagnosis not present

## 2014-12-01 DIAGNOSIS — E785 Hyperlipidemia, unspecified: Secondary | ICD-10-CM | POA: Diagnosis not present

## 2014-12-01 DIAGNOSIS — Z950 Presence of cardiac pacemaker: Secondary | ICD-10-CM | POA: Diagnosis not present

## 2014-12-04 DIAGNOSIS — E785 Hyperlipidemia, unspecified: Secondary | ICD-10-CM | POA: Diagnosis not present

## 2014-12-04 DIAGNOSIS — H8113 Benign paroxysmal vertigo, bilateral: Secondary | ICD-10-CM | POA: Diagnosis not present

## 2014-12-04 DIAGNOSIS — R4182 Altered mental status, unspecified: Secondary | ICD-10-CM | POA: Diagnosis not present

## 2014-12-04 DIAGNOSIS — I639 Cerebral infarction, unspecified: Secondary | ICD-10-CM | POA: Diagnosis not present

## 2014-12-04 DIAGNOSIS — Z950 Presence of cardiac pacemaker: Secondary | ICD-10-CM | POA: Diagnosis not present

## 2014-12-05 DIAGNOSIS — E785 Hyperlipidemia, unspecified: Secondary | ICD-10-CM | POA: Diagnosis not present

## 2014-12-05 DIAGNOSIS — H8113 Benign paroxysmal vertigo, bilateral: Secondary | ICD-10-CM | POA: Diagnosis not present

## 2014-12-05 DIAGNOSIS — R4182 Altered mental status, unspecified: Secondary | ICD-10-CM | POA: Diagnosis not present

## 2014-12-05 DIAGNOSIS — I639 Cerebral infarction, unspecified: Secondary | ICD-10-CM | POA: Diagnosis not present

## 2014-12-05 DIAGNOSIS — Z950 Presence of cardiac pacemaker: Secondary | ICD-10-CM | POA: Diagnosis not present

## 2014-12-07 DIAGNOSIS — R4182 Altered mental status, unspecified: Secondary | ICD-10-CM | POA: Diagnosis not present

## 2014-12-07 DIAGNOSIS — I639 Cerebral infarction, unspecified: Secondary | ICD-10-CM | POA: Diagnosis not present

## 2014-12-07 DIAGNOSIS — H8113 Benign paroxysmal vertigo, bilateral: Secondary | ICD-10-CM | POA: Diagnosis not present

## 2014-12-07 DIAGNOSIS — E785 Hyperlipidemia, unspecified: Secondary | ICD-10-CM | POA: Diagnosis not present

## 2014-12-07 DIAGNOSIS — Z950 Presence of cardiac pacemaker: Secondary | ICD-10-CM | POA: Diagnosis not present

## 2014-12-11 DIAGNOSIS — H8113 Benign paroxysmal vertigo, bilateral: Secondary | ICD-10-CM | POA: Diagnosis not present

## 2014-12-11 DIAGNOSIS — E785 Hyperlipidemia, unspecified: Secondary | ICD-10-CM | POA: Diagnosis not present

## 2014-12-11 DIAGNOSIS — R4182 Altered mental status, unspecified: Secondary | ICD-10-CM | POA: Diagnosis not present

## 2014-12-11 DIAGNOSIS — Z950 Presence of cardiac pacemaker: Secondary | ICD-10-CM | POA: Diagnosis not present

## 2014-12-11 DIAGNOSIS — I639 Cerebral infarction, unspecified: Secondary | ICD-10-CM | POA: Diagnosis not present

## 2014-12-12 DIAGNOSIS — I639 Cerebral infarction, unspecified: Secondary | ICD-10-CM | POA: Diagnosis not present

## 2014-12-12 DIAGNOSIS — Z950 Presence of cardiac pacemaker: Secondary | ICD-10-CM | POA: Diagnosis not present

## 2014-12-12 DIAGNOSIS — E785 Hyperlipidemia, unspecified: Secondary | ICD-10-CM | POA: Diagnosis not present

## 2014-12-12 DIAGNOSIS — H8113 Benign paroxysmal vertigo, bilateral: Secondary | ICD-10-CM | POA: Diagnosis not present

## 2014-12-12 DIAGNOSIS — R4182 Altered mental status, unspecified: Secondary | ICD-10-CM | POA: Diagnosis not present

## 2014-12-13 DIAGNOSIS — L438 Other lichen planus: Secondary | ICD-10-CM | POA: Diagnosis not present

## 2014-12-14 DIAGNOSIS — E785 Hyperlipidemia, unspecified: Secondary | ICD-10-CM | POA: Diagnosis not present

## 2014-12-14 DIAGNOSIS — R4182 Altered mental status, unspecified: Secondary | ICD-10-CM | POA: Diagnosis not present

## 2014-12-14 DIAGNOSIS — Z950 Presence of cardiac pacemaker: Secondary | ICD-10-CM | POA: Diagnosis not present

## 2014-12-14 DIAGNOSIS — I639 Cerebral infarction, unspecified: Secondary | ICD-10-CM | POA: Diagnosis not present

## 2014-12-14 DIAGNOSIS — H8113 Benign paroxysmal vertigo, bilateral: Secondary | ICD-10-CM | POA: Diagnosis not present

## 2014-12-18 DIAGNOSIS — Z950 Presence of cardiac pacemaker: Secondary | ICD-10-CM | POA: Diagnosis not present

## 2014-12-18 DIAGNOSIS — H8113 Benign paroxysmal vertigo, bilateral: Secondary | ICD-10-CM | POA: Diagnosis not present

## 2014-12-18 DIAGNOSIS — I639 Cerebral infarction, unspecified: Secondary | ICD-10-CM | POA: Diagnosis not present

## 2014-12-18 DIAGNOSIS — E785 Hyperlipidemia, unspecified: Secondary | ICD-10-CM | POA: Diagnosis not present

## 2014-12-18 DIAGNOSIS — R4182 Altered mental status, unspecified: Secondary | ICD-10-CM | POA: Diagnosis not present

## 2014-12-19 DIAGNOSIS — I639 Cerebral infarction, unspecified: Secondary | ICD-10-CM | POA: Diagnosis not present

## 2014-12-19 DIAGNOSIS — H8113 Benign paroxysmal vertigo, bilateral: Secondary | ICD-10-CM | POA: Diagnosis not present

## 2014-12-19 DIAGNOSIS — E785 Hyperlipidemia, unspecified: Secondary | ICD-10-CM | POA: Diagnosis not present

## 2014-12-19 DIAGNOSIS — Z950 Presence of cardiac pacemaker: Secondary | ICD-10-CM | POA: Diagnosis not present

## 2014-12-19 DIAGNOSIS — R4182 Altered mental status, unspecified: Secondary | ICD-10-CM | POA: Diagnosis not present

## 2014-12-25 DIAGNOSIS — H8113 Benign paroxysmal vertigo, bilateral: Secondary | ICD-10-CM | POA: Diagnosis not present

## 2014-12-25 DIAGNOSIS — E785 Hyperlipidemia, unspecified: Secondary | ICD-10-CM | POA: Diagnosis not present

## 2014-12-25 DIAGNOSIS — I639 Cerebral infarction, unspecified: Secondary | ICD-10-CM | POA: Diagnosis not present

## 2014-12-25 DIAGNOSIS — R4182 Altered mental status, unspecified: Secondary | ICD-10-CM | POA: Diagnosis not present

## 2014-12-25 DIAGNOSIS — Z950 Presence of cardiac pacemaker: Secondary | ICD-10-CM | POA: Diagnosis not present

## 2014-12-26 DIAGNOSIS — E785 Hyperlipidemia, unspecified: Secondary | ICD-10-CM | POA: Diagnosis not present

## 2014-12-26 DIAGNOSIS — H8113 Benign paroxysmal vertigo, bilateral: Secondary | ICD-10-CM | POA: Diagnosis not present

## 2014-12-26 DIAGNOSIS — R4182 Altered mental status, unspecified: Secondary | ICD-10-CM | POA: Diagnosis not present

## 2014-12-26 DIAGNOSIS — I639 Cerebral infarction, unspecified: Secondary | ICD-10-CM | POA: Diagnosis not present

## 2014-12-26 DIAGNOSIS — Z950 Presence of cardiac pacemaker: Secondary | ICD-10-CM | POA: Diagnosis not present

## 2015-01-01 DIAGNOSIS — R4182 Altered mental status, unspecified: Secondary | ICD-10-CM | POA: Diagnosis not present

## 2015-01-01 DIAGNOSIS — I639 Cerebral infarction, unspecified: Secondary | ICD-10-CM | POA: Diagnosis not present

## 2015-01-01 DIAGNOSIS — H8113 Benign paroxysmal vertigo, bilateral: Secondary | ICD-10-CM | POA: Diagnosis not present

## 2015-01-01 DIAGNOSIS — Z950 Presence of cardiac pacemaker: Secondary | ICD-10-CM | POA: Diagnosis not present

## 2015-01-01 DIAGNOSIS — E785 Hyperlipidemia, unspecified: Secondary | ICD-10-CM | POA: Diagnosis not present

## 2015-01-03 ENCOUNTER — Encounter: Payer: Self-pay | Admitting: *Deleted

## 2015-01-04 DIAGNOSIS — I639 Cerebral infarction, unspecified: Secondary | ICD-10-CM | POA: Diagnosis not present

## 2015-01-04 DIAGNOSIS — E785 Hyperlipidemia, unspecified: Secondary | ICD-10-CM | POA: Diagnosis not present

## 2015-01-04 DIAGNOSIS — H8113 Benign paroxysmal vertigo, bilateral: Secondary | ICD-10-CM | POA: Diagnosis not present

## 2015-01-04 DIAGNOSIS — R4182 Altered mental status, unspecified: Secondary | ICD-10-CM | POA: Diagnosis not present

## 2015-01-04 DIAGNOSIS — Z950 Presence of cardiac pacemaker: Secondary | ICD-10-CM | POA: Diagnosis not present

## 2015-01-08 DIAGNOSIS — E785 Hyperlipidemia, unspecified: Secondary | ICD-10-CM | POA: Diagnosis not present

## 2015-01-08 DIAGNOSIS — I639 Cerebral infarction, unspecified: Secondary | ICD-10-CM | POA: Diagnosis not present

## 2015-01-08 DIAGNOSIS — Z950 Presence of cardiac pacemaker: Secondary | ICD-10-CM | POA: Diagnosis not present

## 2015-01-08 DIAGNOSIS — R4182 Altered mental status, unspecified: Secondary | ICD-10-CM | POA: Diagnosis not present

## 2015-01-08 DIAGNOSIS — H8113 Benign paroxysmal vertigo, bilateral: Secondary | ICD-10-CM | POA: Diagnosis not present

## 2015-01-26 ENCOUNTER — Encounter: Payer: Self-pay | Admitting: *Deleted

## 2015-02-22 ENCOUNTER — Encounter: Payer: Self-pay | Admitting: *Deleted

## 2015-04-04 ENCOUNTER — Ambulatory Visit (INDEPENDENT_AMBULATORY_CARE_PROVIDER_SITE_OTHER): Payer: Medicare Other | Admitting: *Deleted

## 2015-04-04 DIAGNOSIS — I495 Sick sinus syndrome: Secondary | ICD-10-CM | POA: Diagnosis not present

## 2015-04-04 DIAGNOSIS — Z1231 Encounter for screening mammogram for malignant neoplasm of breast: Secondary | ICD-10-CM | POA: Diagnosis not present

## 2015-04-04 DIAGNOSIS — M810 Age-related osteoporosis without current pathological fracture: Secondary | ICD-10-CM | POA: Diagnosis not present

## 2015-04-04 LAB — MDC_IDC_ENUM_SESS_TYPE_INCLINIC
Battery Impedance: 1000 Ohm — CL
Battery Voltage: 2.79 V
Brady Statistic RA Percent Paced: 15 %
Brady Statistic RV Percent Paced: 2.6 %
Date Time Interrogation Session: 20160406143323
Lead Channel Impedance Value: 392 Ohm
Lead Channel Impedance Value: 487 Ohm
Lead Channel Pacing Threshold Amplitude: 0.375 V
Lead Channel Pacing Threshold Amplitude: 0.5 V
Lead Channel Pacing Threshold Pulse Width: 0.4 ms
Lead Channel Sensing Intrinsic Amplitude: 2.4 mV
Lead Channel Sensing Intrinsic Amplitude: 7.3 mV
Lead Channel Setting Pacing Amplitude: 2 V
Lead Channel Setting Pacing Pulse Width: 0.4 ms
Lead Channel Setting Sensing Sensitivity: 2 mV
MDC IDC MSMT LEADCHNL RA PACING THRESHOLD PULSEWIDTH: 0.4 ms
MDC IDC PG SERIAL: 2228688

## 2015-04-04 NOTE — Progress Notes (Signed)
Pacemaker check in clinic. Normal device function. Thresholds, sensing, impedances consistent with previous measurements. Device programmed to maximize longevity. 11 mode switches--longest was 16 seconds. No high ventricular rates noted. Device programmed at appropriate safety margins. Histogram distribution appropriate for patient activity level. Device programmed to optimize intrinsic conduction. Estimated longevity 5.75 to 9.25 years. ROV in 6 mths w/JA.

## 2015-04-11 DIAGNOSIS — R413 Other amnesia: Secondary | ICD-10-CM | POA: Diagnosis not present

## 2015-04-11 DIAGNOSIS — E780 Pure hypercholesterolemia: Secondary | ICD-10-CM | POA: Diagnosis not present

## 2015-04-11 DIAGNOSIS — Z79899 Other long term (current) drug therapy: Secondary | ICD-10-CM | POA: Diagnosis not present

## 2015-04-11 DIAGNOSIS — M810 Age-related osteoporosis without current pathological fracture: Secondary | ICD-10-CM | POA: Diagnosis not present

## 2015-04-11 LAB — TSH: TSH: 1.53 u[IU]/mL (ref <=5.90)

## 2015-04-18 ENCOUNTER — Encounter: Payer: Self-pay | Admitting: Internal Medicine

## 2015-05-03 ENCOUNTER — Ambulatory Visit (HOSPITAL_COMMUNITY): Admission: RE | Admit: 2015-05-03 | Payer: Medicare Other | Source: Ambulatory Visit

## 2015-05-03 ENCOUNTER — Other Ambulatory Visit (HOSPITAL_COMMUNITY): Payer: Self-pay | Admitting: Family Medicine

## 2015-05-07 ENCOUNTER — Ambulatory Visit (HOSPITAL_COMMUNITY)
Admission: RE | Admit: 2015-05-07 | Discharge: 2015-05-07 | Disposition: A | Discharge status: Home / Self Care | Payer: Medicare Other | Source: Ambulatory Visit | Attending: Family Medicine | Admitting: Family Medicine

## 2015-05-07 ENCOUNTER — Encounter (HOSPITAL_COMMUNITY): Payer: Self-pay

## 2015-05-07 DIAGNOSIS — M810 Age-related osteoporosis without current pathological fracture: Secondary | ICD-10-CM | POA: Insufficient documentation

## 2015-05-07 HISTORY — DX: Unspecified dementia, unspecified severity, without behavioral disturbance, psychotic disturbance, mood disturbance, and anxiety: F03.90

## 2015-05-07 MED ORDER — ZOLEDRONIC ACID 5 MG/100ML IV SOLN
5.0000 mg | Freq: Once | INTRAVENOUS | Status: AC
  Administered 2015-05-07: 5 mg via INTRAVENOUS
  Filled 2015-05-07: qty 100

## 2015-05-07 MED ORDER — SODIUM CHLORIDE 0.9 % IV SOLN
Freq: Once | INTRAVENOUS | Status: AC
  Administered 2015-05-07: 13:00:00 via INTRAVENOUS

## 2015-05-07 NOTE — Discharge Instructions (Signed)

## 2015-06-29 DIAGNOSIS — H43393 Other vitreous opacities, bilateral: Secondary | ICD-10-CM | POA: Diagnosis not present

## 2015-09-27 DIAGNOSIS — Z23 Encounter for immunization: Secondary | ICD-10-CM | POA: Diagnosis not present

## 2015-10-22 ENCOUNTER — Encounter: Payer: Self-pay | Admitting: Internal Medicine

## 2015-11-13 ENCOUNTER — Encounter: Payer: Self-pay | Admitting: *Deleted

## 2015-12-13 DIAGNOSIS — I609 Nontraumatic subarachnoid hemorrhage, unspecified: Secondary | ICD-10-CM | POA: Diagnosis not present

## 2015-12-13 DIAGNOSIS — E7800 Pure hypercholesterolemia, unspecified: Secondary | ICD-10-CM | POA: Diagnosis not present

## 2015-12-13 DIAGNOSIS — Z79899 Other long term (current) drug therapy: Secondary | ICD-10-CM | POA: Diagnosis not present

## 2015-12-13 DIAGNOSIS — Z853 Personal history of malignant neoplasm of breast: Secondary | ICD-10-CM | POA: Diagnosis not present

## 2015-12-13 DIAGNOSIS — M810 Age-related osteoporosis without current pathological fracture: Secondary | ICD-10-CM | POA: Diagnosis not present

## 2015-12-13 DIAGNOSIS — Z23 Encounter for immunization: Secondary | ICD-10-CM | POA: Diagnosis not present

## 2015-12-13 DIAGNOSIS — Z950 Presence of cardiac pacemaker: Secondary | ICD-10-CM | POA: Diagnosis not present

## 2015-12-13 DIAGNOSIS — R413 Other amnesia: Secondary | ICD-10-CM | POA: Diagnosis not present

## 2015-12-25 ENCOUNTER — Telehealth: Payer: Self-pay

## 2015-12-26 NOTE — Telephone Encounter (Signed)
error 

## 2016-01-07 ENCOUNTER — Ambulatory Visit: Payer: Self-pay | Admitting: Physician Assistant

## 2016-02-27 ENCOUNTER — Encounter: Payer: BLUE CROSS/BLUE SHIELD | Admitting: Nurse Practitioner

## 2016-03-25 ENCOUNTER — Non-Acute Institutional Stay: Payer: Medicare Other | Admitting: Internal Medicine

## 2016-03-25 ENCOUNTER — Encounter: Payer: Self-pay | Admitting: Internal Medicine

## 2016-03-25 VITALS — BP 114/70 | HR 70 | Temp 97.8°F | Ht 62.0 in | Wt 100.0 lb

## 2016-03-25 DIAGNOSIS — I480 Paroxysmal atrial fibrillation: Secondary | ICD-10-CM | POA: Diagnosis not present

## 2016-03-25 DIAGNOSIS — Z950 Presence of cardiac pacemaker: Secondary | ICD-10-CM

## 2016-03-25 DIAGNOSIS — Z87820 Personal history of traumatic brain injury: Secondary | ICD-10-CM

## 2016-03-25 DIAGNOSIS — M810 Age-related osteoporosis without current pathological fracture: Secondary | ICD-10-CM | POA: Diagnosis not present

## 2016-03-25 DIAGNOSIS — L439 Lichen planus, unspecified: Secondary | ICD-10-CM

## 2016-03-25 DIAGNOSIS — R413 Other amnesia: Secondary | ICD-10-CM

## 2016-03-27 ENCOUNTER — Encounter: Payer: Self-pay | Admitting: Internal Medicine

## 2016-03-27 ENCOUNTER — Encounter: Payer: Self-pay | Admitting: Nurse Practitioner

## 2016-03-27 ENCOUNTER — Ambulatory Visit (INDEPENDENT_AMBULATORY_CARE_PROVIDER_SITE_OTHER): Payer: Medicare Other | Admitting: Nurse Practitioner

## 2016-03-27 VITALS — BP 104/72 | HR 60 | Ht 62.0 in | Wt 99.0 lb

## 2016-03-27 DIAGNOSIS — I480 Paroxysmal atrial fibrillation: Secondary | ICD-10-CM

## 2016-03-27 DIAGNOSIS — I495 Sick sinus syndrome: Secondary | ICD-10-CM

## 2016-03-27 NOTE — Progress Notes (Signed)
Electrophysiology Office Note Date: 03/27/2016  ID:  GENELLE Gallagher, DOB January 20, 1935, MRN [id number]  PCP: [name], MD Electrophysiologist: Rayann Heman  CC: Pacemaker follow-up  [name] is a 81 y.o. female seen today for Dr Rayann Heman.  She presents today for routine electrophysiology followup.  Since last being seen in our clinic, the patient reports doing reasonably well.  She remains very active and has moved to [facility] which has been a good change for her. She continues to have intermittent short episodes of palpitations and chronic stable DOE but denies chest pain, PND, orthopnea, nausea, vomiting, dizziness, syncope, edema, weight gain, or early satiety.  Device History: STJ dual chamber PPM implanted 2009 for sinus node dysfunction   Past Medical History  Diagnosis Date  . Sinus node dysfunction (HCC)     with syncope; status post implantation of a St. Jude Medical Zephyr XL model 5826 dual-chamber pacemaker  November 27, 2008.  . Subarachnoid hemorrhage following injury ([place])     right frontal, following traumatic brain injury  . Hyponatremia   . FH: mastectomy     right side   . Dementia     forgetful  . Osteoporosis   . Breast cancer ([place]) 1996    right side  . Pacemaker    Past Surgical History  Procedure Laterality Date  . Pacemaker insertion  11/27/08    by JA (STM)  . Mastectomy Right 1996    By T. Davis  . Cesarean section    . Appendectomy    . Abdominal hysterectomy    . Breast enhancement surgery Right 1997    By [place] Nation    Current Outpatient Prescriptions  Medication Sig Dispense Refill  . cholecalciferol (VITAMIN D) 1000 UNITS tablet Take 1,000 Units by mouth. Take 2 daily    . Multiple Vitamin (MULITIVITAMIN WITH MINERALS) TABS Take 1 tablet by mouth daily.    . Omega-3 Fatty Acids (FISH OIL PO) Take 1 tablet by mouth daily. Pt unsure of dosage     No current facility-administered medications for this visit.    Allergies:    Actonel; Aspirin; Caffeine; Calcium; Codeine; Evista; Fosamax; Morphine; Penicillins; Prednisone; Propoxyphene hcl; Propoxyphene n-acetaminophen; and Sulfa antibiotics   Social History: Social History   Social History  . Marital Status: Married    Spouse Name: N/A  . Number of Children: N/A  . Years of Education: N/A   Occupational History  . retired Building control surveyor in [place]  . Smoking status: Former Smoker    Quit date: 03/25/1996  . Smokeless tobacco: Never Used  . Alcohol Use: No  . Drug Use: 1.00 per week     Comment: 1 glass of wine daily  . Sexual Activity: Not on file   Other Topics Concern  . Not on file   Social History Narrative   Tobacco use, amount per day now: 0      Past tobacco use, amount per day: Years ago 20+      How many years did you use tobacco: 20 years      Alcohol use (drinks per week): Wine 1 glass daily      Diet: Regular      Do you drink/eat things with caffeine? Yes      Marital status: Widowed             What year were you married? 1959      Do you live  in a house, apartment, assisted living, condo, trailer? [place]      Is it one or more stories? 0      How many persons live in your home? 0      Do you have any pets in your home? 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  Walk           How often? Daily -twice a day       Do you have a living will? Yes      Do you have a DNR form? Yes           If not, do you want to discuss one?      Do you have signed POA/HPOA forms? Yes                  Family History: Family History  Problem Relation Age of Onset  . Heart attack Mother 37  . Stroke Father 4  . Alcohol abuse Father 37  . Alcoholism Brother 5     Review of Systems: All other systems reviewed and are otherwise negative except as noted above.   Physical Exam: VS:  BP 104/72 mmHg  Pulse 60  Ht 5\' 2"  (1.575 m)  Wt 99 lb (44.906 kg)   BMI 18.10 kg/m2 , BMI Body mass index is 18.1 kg/(m^2).  GEN- The patient is elderly and thin appearing, alert and oriented x 3 today.   HEENT: normocephalic, atraumatic; sclera clear, conjunctiva pink; hearing intact; oropharynx clear; neck supple  Lungs- Clear to ausculation bilaterally, normal work of breathing.  No wheezes, rales, rhonchi Heart- Regular rate and rhythm  GI- soft, non-tender, non-distended, bowel sounds present  Extremities- no clubbing, cyanosis, or edema; DP/PT/radial pulses 2+ bilaterally MS- no significant deformity or atrophy Skin- warm and dry, no rash or lesion; PPM pocket well healed Psych- euthymic mood, full affect Neuro- strength and sensation are intact  PPM Interrogation- reviewed in detail today,  See PACEART report  EKG:  EKG is ordered today. The ekg ordered today shows sinus rhythm  Recent Labs: No results found for requested labs within last 365 days.   Wt Readings from Last 3 Encounters:  03/27/16 99 lb (44.906 kg)  03/25/16 100 lb (45.36 kg)  03/13/14 90 lb 12.8 oz (41.187 kg)     Other studies Reviewed: Additional studies/ records that were reviewed today include: Dr Jackalyn Lombard office notes  Assessment and Plan:  1.  Sinus node dysfunction Normal PPM function See Pace Art report No changes today  2.  Paroxysmal atrial fibrillation Burden by device interrogation today <1% Atrial rates are all <200bpm She previously declined [place] with prior SDH.  CHADS2VASC is 3.  Discussed Watchman as an option today if AF burden increases. I do not feel that with current low AF burden and no atrial rates >300 that this is indicated at this time.    Current medicines are reviewed at length with the patient today.   The patient does not have concerns regarding her medicines.  The following changes were made today:  none  Labs/ tests ordered today include: none   Disposition:   Follow up with device clinic 6 months, Dr Rayann Heman 1 year     Signed, Chanetta Marshall, NP 03/27/2016 11:17 AM  [facility] [address] [place] [address] [phone] (office) [phone] (fax)

## 2016-03-27 NOTE — Patient Instructions (Signed)
Medication Instructions:   Your physician recommends that you continue on your current medications as directed. Please refer to the Current Medication list given to you today.   If you need a refill on your cardiac medications before your next appointment, please call your pharmacy.  Labwork: NONE ORDER TODAY    Testing/Procedures: NONE ORDER TODAY   Follow-Up: IN 6 MONTHS WITH DEVICE CLINIC  Your physician wants you to follow-up in: ONE YEAR WITH ALLRED You will receive a reminder letter in the mail two months in advance. If you don't receive a letter, please call our office to schedule the follow-up appointment.     Any Other Special Instructions Will Be Listed Below (If Applicable).                                                                                                                                                   

## 2016-03-28 LAB — CUP PACEART INCLINIC DEVICE CHECK
Date Time Interrogation Session: 20170331130041
Implantable Lead Implant Date: 20091130
Implantable Lead Implant Date: 20091130
Implantable Lead Location: 753860
Lead Channel Setting Pacing Amplitude: 2 V
Lead Channel Setting Pacing Pulse Width: 0.4 ms
Lead Channel Setting Sensing Sensitivity: 2 mV
MDC IDC LEAD LOCATION: 753859
Pulse Gen Model: 5826
Pulse Gen Serial Number: 2228688

## 2016-04-06 ENCOUNTER — Encounter: Payer: Self-pay | Admitting: Internal Medicine

## 2016-04-06 DIAGNOSIS — M810 Age-related osteoporosis without current pathological fracture: Secondary | ICD-10-CM | POA: Insufficient documentation

## 2016-04-06 DIAGNOSIS — L439 Lichen planus, unspecified: Secondary | ICD-10-CM | POA: Insufficient documentation

## 2016-04-06 DIAGNOSIS — R413 Other amnesia: Secondary | ICD-10-CM | POA: Insufficient documentation

## 2016-04-06 DIAGNOSIS — Z950 Presence of cardiac pacemaker: Secondary | ICD-10-CM | POA: Insufficient documentation

## 2016-04-06 NOTE — Progress Notes (Signed)
Patient ID: [name], female   DOB: 07-Nov-1935, 81 y.o.   MRN: [id number]    HISTORY AND PHYSICAL  Location:  [place] Room Number: 28 Place of Service: Clinic (12)   Extended Emergency Contact Information Primary Emergency Contact: [facility] Address: [address] [place] [address] [name] of [place] Phone: [phone] Work Phone: [phone] Mobile Phone: [phone] Relation: Son Secondary Emergency Contact: Melida Quitter [place] of [place] Phone: [phone] Relation: Daughter  Advanced Directive information Does patient have an advance directive?: Yes, Type of Advance Directive: Healthcare Power of [place];Living will;Out of facility DNR (pink MOST or yellow form)  Chief Complaint  Patient presents with  . Medical Management of Chronic Issues    New Patient to get est with [facility]. Moved to [place] 01/04/16    HPI:  Patient presents as a new patient to my practice. Seen at [facility] clinic. Overall, she feels that she is doing well at present time.  Osteoporosis - intolerant to oral bisphosphonates including Fosamax. Had been receiving Reclast annually at her 35 office.  Memory change - mild progressive changes. Previous traumatic brain injury with subarachnoid hemorrhage   Personal history of traumatic brain injury - subarachnoid hemorrhage following a fall   Paroxysmal atrial fibrillation (HCC) - currently in normal sinus rhythm   Pacemaker - sinus node dysfunction. Pacemaker implantation in left upper chest .  Lichen planus - chronic long-standing problem treated with topical steroids and Benadryl    Past Medical History  Diagnosis Date  . Sinus node dysfunction (HCC)     with syncope; status post implantation of a St. Jude Medical Zephyr XL model 5826 dual-chamber pacemaker  November 27, 2008.  . Subarachnoid hemorrhage following injury ([place])     right frontal,  following traumatic brain injury  . Hyponatremia   . FH: mastectomy     right side   . Dementia     forgetful  . Osteoporosis   . Breast cancer ([place]) 1996    right side  . Pacemaker   . Lichen planus     Past Surgical History  Procedure Laterality Date  . Pacemaker insertion  11/27/08    by JA (STM)  . Mastectomy Right 1996    By T. Davis  . Cesarean section  1966  . Appendectomy    . Abdominal hysterectomy    . Breast enhancement surgery Right 1997    By [place] Nation  . Tonsillectomy      Patient Care Team: [name], MD as PCP - General (Internal Medicine)  Social History   Social History  . Marital Status: Married    Spouse Name: N/A  . Number of Children: N/A  . Years of Education: N/A   Occupational History  . 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 Smoking status: Former Smoker    Quit date: 03/25/1996  . Smokeless tobacco: Never Used  . Alcohol Use: 4.2 oz/week    0 Standard drinks or equivalent, 7 Glasses of wine, 0 Cans of beer, 0 Shots of liquor per week  . Drug Use: 1.00 per week     Comment: 1 glass of wine daily  . Sexual Activity: Not on file   Other Topics Concern  . Not on file   Social History Narrative   Tobacco use, amount per day now: 0  Past tobacco use, amount per day: Years ago 20+      How many years did you use tobacco: 20 years      Alcohol use (drinks per week): Wine 1 glass daily      Diet: Regular      Do you drink/eat things with caffeine? Yes      Marital status: Widowed             What year were you married? 1959      Do you live in a house, apartment, assisted living, condo, trailer? [place]      Is it one or more stories? 0      How many persons live in your home? 0      Do you have any pets in your home? 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  Walk           How often? Daily -twice a day       Do you have a living will? Yes       Do you have a DNR form? Yes           If not, do you want to discuss one?      Do you have signed POA/HPOA forms? Yes                  reports that she quit smoking about 20 years ago. She has never used smokeless tobacco. She reports that she drinks about 4.2 oz of alcohol per week. She reports that she uses illicit drugs about once per week.  Family History  Problem Relation Age of Onset  . Heart attack Mother 83  . Stroke Father 21  . Alcohol abuse Father 76  . Alcoholism Brother 6   Family Status  Relation Status Death Age  . Mother Deceased 70  . Father Deceased 70  . Brother Deceased 42  . Brother Alive   . Son Alive   . Daughter Alive     Immunization History  Administered Date(s) Administered  . Influenza-Unspecified 12/29/2014  . Pneumococcal-Unspecified 09/28/2008  . Tdap 08/03/2012    Allergies  Allergen Reactions  . Actonel [Risedronate Sodium]   . Aspirin   . Caffeine   . Calcium   . Codeine   . Evista [Raloxifene]   . Fosamax [Alendronate Sodium]   . Morphine   . Penicillins   . Prednisone   . Propoxyphene Hcl   . Propoxyphene N-Acetaminophen   . Sulfa Antibiotics     Medications: Patient's Medications  New Prescriptions   No medications on file  Previous Medications   CHOLECALCIFEROL (VITAMIN D) 1000 UNITS TABLET    Take 1,000 Units by mouth. Take 2 daily   MULTIPLE VITAMIN (MULITIVITAMIN WITH MINERALS) TABS    Take 1 tablet by mouth daily.   OMEGA-3 FATTY ACIDS (FISH OIL PO)    Take 1 tablet by mouth daily. Pt unsure of dosage  Modified Medications   No medications on file  Discontinued Medications   ASCORBIC ACID (VITAMIN C PO)    Take 1 tablet by mouth daily. Pt unsure of dosage   BETA CAROTENE PO    Take 1 tablet by mouth daily. Pt unsure of dosage   CALCIUM CARBONATE ANTACID (ALKA-SELTZER ANTACID PO)    Take 1 tablet by mouth as needed.   DIPHENHYDRAMINE (BENADRYL) 25 MG CAPSULE  Take 25 mg by mouth every 6 (six) hours as  needed.   GRAPE SEED CR PO    Take 1 tablet by mouth daily. Pt unsure of dosage   IBUPROFEN (ADVIL) 200 MG TABLET    Take 200 mg by mouth every 6 (six) hours as needed.   VITAMIN E PO    Take 1 tablet by mouth daily. Pt unsure of dosage    Review of Systems  Constitutional: Negative for fever, chills, diaphoresis, activity change, appetite change, fatigue and unexpected weight change.       Breast cancer in 1996  HENT: Positive for hearing loss (Mild hearing loss). Negative for congestion, ear discharge, ear pain, postnasal drip, rhinorrhea, sore throat, tinnitus, trouble swallowing and voice change.   Eyes: Positive for visual disturbance (Corrective lenses). Negative for pain, redness and itching.  Respiratory: Negative for cough, choking, shortness of breath and wheezing.   Cardiovascular: Negative for chest pain, palpitations and leg swelling.  Gastrointestinal: Negative for nausea, abdominal pain, diarrhea, constipation and abdominal distention.       Flatulence  Endocrine: Negative for cold intolerance, heat intolerance, polydipsia, polyphagia and polyuria.  Genitourinary: Negative for dysuria, urgency, frequency, hematuria, flank pain, vaginal discharge, difficulty urinating and pelvic pain.  Musculoskeletal: Negative for myalgias, back pain, arthralgias, gait problem, neck pain and neck stiffness.  Skin: Negative for color change, pallor and rash.       Chronic lichen planus with itching and sometimes a rash.  Allergic/Immunologic: Negative.   Neurological: Negative for dizziness, tremors, seizures, syncope, weakness, numbness and headaches.       Increasing memory loss and confusion  Hematological: Negative for adenopathy. Bruises/bleeds easily.  Psychiatric/Behavioral: Negative for suicidal ideas, hallucinations, behavioral problems, confusion, sleep disturbance, dysphoric mood and agitation. The patient is nervous/anxious. The patient is not hyperactive.     Filed Vitals:    03/25/16 1045  BP: 114/70  Pulse: 70  Temp: 97.8 F (36.6 C)  TempSrc: Oral  Height: 5\' 2"  (1.575 m)  Weight: 100 lb (45.36 kg)  SpO2: 99%   Body mass index is 18.29 kg/(m^2). Filed Weights   03/25/16 1045  Weight: 100 lb (45.36 kg)     Physical Exam  Constitutional: She appears well-developed and well-nourished. No distress.  HENT:  Right Ear: External ear normal.  Left Ear: External ear normal.  Nose: Nose normal.  Mouth/Throat: Oropharynx is clear and moist. No oropharyngeal exudate.  Eyes: Conjunctivae and EOM are normal. Pupils are equal, round, and reactive to light. No scleral icterus.  Neck: No JVD present. No tracheal deviation present. No thyromegaly present.  Cardiovascular: Normal rate, regular rhythm and normal heart sounds.  Exam reveals no gallop and no friction rub.   No murmur heard. Pacemaker left upper chest wall. Diminished DP and PT bilaterally.  Pulmonary/Chest: Effort normal. No respiratory distress. She has no wheezes. She has no rales. She exhibits no tenderness.  Abdominal: She exhibits no distension and no mass. There is no tenderness.  Musculoskeletal: Normal range of motion. She exhibits no edema or tenderness.  Lymphadenopathy:    She has no cervical adenopathy.  Neurological: She is alert. No cranial nerve deficit. Coordination normal.  Mild memory loss  Skin: No rash noted. She is not diaphoretic. No erythema. No pallor.  Dry rash in localized areas consistent with lichen planus  Psychiatric: Her behavior is normal. Judgment and thought content normal.  Mild anxiety    Labs reviewed: No flowsheet data found. Lab Results  Component Value  Date   BUN 8 12/07/2008   No results found for: HGBA1C Lab Results  Component Value Date   TSH 0.775 *Test methodology is 3rd generation TSH* 11/26/2008     Assessment/Plan  1. Osteoporosis Continue with Reclast  2. Memory change MMSE next visit  3. Personal history of traumatic brain  injury Possible contributor to memory change   4. Paroxysmal atrial fibrillation (HCC) Currently normal sinus rhythm  5. Pacemaker Appears to be functioning well. Has appointment with cardiologist soon and polyuria   6. Lichen planus Continue with topical treatments

## 2016-04-09 NOTE — Addendum Note (Signed)
Addended by: [name] on: 04/09/2016 05:03 PM   Modules accepted: Level of Service

## 2016-05-09 ENCOUNTER — Encounter: Payer: Self-pay | Admitting: *Deleted

## 2016-05-09 DIAGNOSIS — Z1231 Encounter for screening mammogram for malignant neoplasm of breast: Secondary | ICD-10-CM | POA: Diagnosis not present

## 2016-05-09 DIAGNOSIS — Z853 Personal history of malignant neoplasm of breast: Secondary | ICD-10-CM | POA: Diagnosis not present

## 2016-05-09 LAB — HM MAMMOGRAPHY

## 2016-06-26 ENCOUNTER — Telehealth: Payer: Self-pay

## 2016-06-26 NOTE — Telephone Encounter (Signed)
Spoke with son and Peter Congo. Dr. Nyoka Cowden has written order for AL to schedule Reclast at [facility]. Family would like to have it schedule for last part of July or first part of August, so they can go with her.

## 2016-06-26 NOTE — Telephone Encounter (Signed)
-----   Message from Marchia Meiers sent at 06/26/2016  9:10 AM EDT ----- Patient [name] Daughter [name] called this morning requesting to schedule an appointment for her mothers reclast. Can you please look into this. I know we don't do this here and I don't really know anything about what she is talking about.   Please call [name] at [phone] Patient: [name] - [id number]  Thank you

## 2016-06-30 DIAGNOSIS — I10 Essential (primary) hypertension: Secondary | ICD-10-CM | POA: Diagnosis not present

## 2016-06-30 LAB — BASIC METABOLIC PANEL
BUN: 16 mg/dL (ref 4–21)
CREATININE: 0.7 mg/dL (ref <=1.1)
Glucose: 86 mg/dL
POTASSIUM: 4.1 mmol/L (ref 3.4–5.3)
Sodium: 142 mmol/L (ref 137–147)

## 2016-06-30 LAB — HEPATIC FUNCTION PANEL
ALK PHOS: 39 U/L (ref 25–125)
ALT: 23 U/L (ref 7–35)
AST: 24 U/L (ref 13–35)
Bilirubin, Total: 9.1 mg/dL

## 2016-07-08 ENCOUNTER — Encounter: Payer: Self-pay | Admitting: Internal Medicine

## 2016-07-08 ENCOUNTER — Non-Acute Institutional Stay: Payer: Medicare Other | Admitting: Internal Medicine

## 2016-07-08 VITALS — BP 110/68 | HR 77 | Temp 97.7°F | Ht 62.0 in | Wt 101.0 lb

## 2016-07-08 DIAGNOSIS — I480 Paroxysmal atrial fibrillation: Secondary | ICD-10-CM

## 2016-07-08 DIAGNOSIS — E871 Hypo-osmolality and hyponatremia: Secondary | ICD-10-CM

## 2016-07-08 DIAGNOSIS — R413 Other amnesia: Secondary | ICD-10-CM

## 2016-07-08 DIAGNOSIS — M810 Age-related osteoporosis without current pathological fracture: Secondary | ICD-10-CM | POA: Diagnosis not present

## 2016-07-17 NOTE — Progress Notes (Signed)
Patient ID: [name], female   DOB: [birth year], 81 y.o.   MRN: [id number]    [place] Room Number: [id number]  Place of Service: Clinic (12)     Allergies  Allergen Reactions  . Actonel [Risedronate Sodium]   . Aspirin   . Caffeine   . Calcium   . Codeine   . Evista [Raloxifene]   . Fosamax [Alendronate Sodium]   . Morphine   . Penicillins   . Prednisone   . Propoxyphene Hcl   . Propoxyphene N-Acetaminophen   . Sulfa Antibiotics     Chief Complaint  Patient presents with  . Medical Management of Chronic Issues    4 month medication management osteoporosis, memory, A-Fib    HPI:  Feeling well. No complaints.  Osteoporosis - using Vit D. Previously got Reclast yearly.  Memory change - no furtheer change peer patient.  Paroxysmal atrial fibrillation (HCC) - currently in normal rhythm. No palpitations or chest pain    Medications: Patient's Medications  New Prescriptions   No medications on file  Previous Medications   CHOLECALCIFEROL (VITAMIN D) 1000 UNITS TABLET    Take 1,000 Units by mouth. Take 2 daily   MULTIPLE VITAMIN (MULITIVITAMIN WITH MINERALS) TABS    Take 1 tablet by mouth daily.   OMEGA-3 FATTY ACIDS (FISH OIL PO)    Take 1 tablet by mouth daily. Pt unsure of dosage   SENNA (SENOKOT) 8.6 MG TABLET    Take 1 tablet by mouth. Take one at bedtime  Modified Medications   No medications on file  Discontinued Medications   No medications on file     Review of Systems  Constitutional: Negative for fever, chills, diaphoresis, activity change, appetite change, fatigue and unexpected weight change.       Breast cancer in 1996  HENT: Positive for hearing loss (Mild hearing loss). Negative for congestion, ear discharge, ear pain, postnasal drip, rhinorrhea, sore throat, tinnitus, trouble swallowing and voice change.   Eyes: Positive for visual disturbance (Corrective lenses). Negative for pain, redness and itching.  Respiratory:  Negative for cough, choking, shortness of breath and wheezing.   Cardiovascular: Negative for chest pain, palpitations and leg swelling.  Gastrointestinal: Negative for nausea, abdominal pain, diarrhea, constipation and abdominal distention.       Flatulence  Endocrine: Negative for cold intolerance, heat intolerance, polydipsia, polyphagia and polyuria.  Genitourinary: Negative for dysuria, urgency, frequency, hematuria, flank pain, vaginal discharge, difficulty urinating and pelvic pain.  Musculoskeletal: Negative for myalgias, back pain, arthralgias, gait problem, neck pain and neck stiffness.  Skin: Negative for color change, pallor and rash.       Chronic lichen planus with itching and sometimes a rash.  Allergic/Immunologic: Negative.   Neurological: Negative for dizziness, tremors, seizures, syncope, weakness, numbness and headaches.       Increasing memory loss and confusion  Hematological: Negative for adenopathy. Bruises/bleeds easily.  Psychiatric/Behavioral: Negative for suicidal ideas, hallucinations, behavioral problems, confusion, sleep disturbance, dysphoric mood and agitation. The patient is nervous/anxious. The patient is not hyperactive.     Filed Vitals:   07/08/16 1012  BP: 110/68  Pulse: 77  Temp: 97.7 F (36.5 C)  TempSrc: Oral  Height: 5\' 2"  (1.575 m)  Weight: 101 lb (45.813 kg)  SpO2: 96%   Wt Readings from Last 3 Encounters:  07/08/16 101 lb (45.813 kg)  03/27/16 99 lb (44.906 kg)  03/25/16 100 lb (45.36 kg)    Body mass index is 18.47  kg/(m^2).  Physical Exam  Constitutional: She appears well-developed and well-nourished. No distress.  HENT:  Right Ear: External ear normal.  Left Ear: External ear normal.  Nose: Nose normal.  Mouth/Throat: Oropharynx is clear and moist. No oropharyngeal exudate.  Eyes: Conjunctivae and EOM are normal. Pupils are equal, round, and reactive to light. No scleral icterus.  Neck: No JVD present. No tracheal deviation  present. No thyromegaly present.  Cardiovascular: Normal rate, regular rhythm and normal heart sounds.  Exam reveals no gallop and no friction rub.   No murmur heard. Pacemaker left upper chest wall. Diminished DP and PT bilaterally.  Pulmonary/Chest: Effort normal. No respiratory distress. She has no wheezes. She has no rales. She exhibits no tenderness.  Abdominal: She exhibits no distension and no mass. There is no tenderness.  Musculoskeletal: Normal range of motion. She exhibits no edema or tenderness.  Lymphadenopathy:    She has no cervical adenopathy.  Neurological: She is alert. No cranial nerve deficit. Coordination normal.  Mild memory loss  Skin: No rash noted. She is not diaphoretic. No erythema. No pallor.  Dry rash in localized areas consistent with lichen planus  Psychiatric: Her behavior is normal. Judgment and thought content normal.  Mild anxiety     Labs reviewed: No flowsheet data found. Lab Results  Component Value Date   TSH 0.775 *Test methodology is 3rd generation TSH* 11/26/2008   Lab Results  Component Value Date   BUN 8 12/07/2008   BUN 9 12/04/2008   BUN 10 12/01/2008   Lab Results  Component Value Date   CREATININE 0.38* 12/07/2008   CREATININE 0.39* 12/04/2008   CREATININE 0.45 12/01/2008   No results found for: HGBA1C     Assessment/Plan  1. Osteoporosis Continue Reclast  2. Memory change MMSE next visit  3. Paroxysmal atrial fibrillation ([place]) Pacemaker. Followed by cardioilogy  4. Hyponatremia -CMP, future

## 2016-07-28 ENCOUNTER — Other Ambulatory Visit (HOSPITAL_COMMUNITY): Payer: Self-pay | Admitting: *Deleted

## 2016-07-29 ENCOUNTER — Ambulatory Visit (HOSPITAL_COMMUNITY)
Admission: RE | Admit: 2016-07-29 | Discharge: 2016-07-29 | Disposition: A | Discharge status: Home / Self Care | Payer: Medicare Other | Source: Ambulatory Visit | Attending: Internal Medicine | Admitting: Internal Medicine

## 2016-07-29 DIAGNOSIS — M810 Age-related osteoporosis without current pathological fracture: Secondary | ICD-10-CM | POA: Insufficient documentation

## 2016-07-29 MED ORDER — ZOLEDRONIC ACID 5 MG/100ML IV SOLN
INTRAVENOUS | Status: AC
  Administered 2016-07-29: 5 mg via INTRAVENOUS
  Filled 2016-07-29: qty 100

## 2016-07-29 MED ORDER — SODIUM CHLORIDE 0.9 % IV SOLN: Freq: Once | INTRAVENOUS | Status: DC

## 2016-07-29 MED ORDER — ZOLEDRONIC ACID 5 MG/100ML IV SOLN
5.0000 mg | Freq: Once | INTRAVENOUS | Status: AC
  Administered 2016-07-29: 5 mg via INTRAVENOUS

## 2016-08-12 ENCOUNTER — Non-Acute Institutional Stay: Payer: Medicare Other | Admitting: Nurse Practitioner

## 2016-08-12 ENCOUNTER — Encounter: Payer: Self-pay | Admitting: Nurse Practitioner

## 2016-08-12 DIAGNOSIS — I480 Paroxysmal atrial fibrillation: Secondary | ICD-10-CM | POA: Diagnosis not present

## 2016-08-12 DIAGNOSIS — Z950 Presence of cardiac pacemaker: Secondary | ICD-10-CM

## 2016-08-12 DIAGNOSIS — K5900 Constipation, unspecified: Secondary | ICD-10-CM | POA: Diagnosis not present

## 2016-08-12 DIAGNOSIS — R413 Other amnesia: Secondary | ICD-10-CM

## 2016-08-12 DIAGNOSIS — E871 Hypo-osmolality and hyponatremia: Secondary | ICD-10-CM

## 2016-08-12 NOTE — Progress Notes (Signed)
Location:   [place] Room Number: 28 Place of Service:  ALF [phone]) Provider:  Marlana Latus  NP   Patient Care Team: [name], MD as PCP - General (Internal Medicine) Madyson Lukach Otho Darner, NP as Nurse Practitioner (Internal Medicine)  Extended Emergency Contact Information Primary Emergency Contact: [facility] Address: [address]          [place] [address] [name] of [place] Phone: [phone] Work Phone: [phone] Mobile Phone: [phone] Relation: Son Secondary Emergency Contact: Melida Quitter [place] of [place] Phone: [phone] Relation: Daughter  Code Status:  DNR Goals of care: Advanced Directive information Advanced Directives 08/12/2016  Does patient have an advance directive? Yes  Type of Paramedic of [place];Out of facility DNR (pink MOST or yellow form)  Does patient want to make changes to advanced directive? No - Patient declined  Copy of advanced directive(s) in chart? Yes  Pre-existing out of facility DNR order (yellow form or pink MOST form) -     Chief Complaint  Patient presents with  . Medical Management of Chronic Issues    HPI:  Pt is a 81 y.o. female seen today for medical management of chronic diseases.    Hx of Afib, heart rate is in control, not on meds, s/p pacemaker, constipation, stable, taking Senokot I daily. Recently she was admitted to [place] for care assistance related to her lack of self sufficiency in [place].   Past Medical History:  Diagnosis Date  . Breast cancer ([place]) 1996   right side  . Dementia    forgetful  . FH: mastectomy    right side   . Hyponatremia   . Lichen planus   . Osteoporosis   . Pacemaker   . Sinus node dysfunction (HCC)    with syncope; status post implantation of a [employer]. [employer] Medical Zephyr XL model 5826 dual-chamber pacemaker  November 27, 2008.  . Subarachnoid hemorrhage following injury ([place])    right frontal,  following traumatic brain injury   Past Surgical History:  Procedure Laterality Date  . ABDOMINAL HYSTERECTOMY    . APPENDECTOMY    . BREAST ENHANCEMENT SURGERY Right 1997   By [place] [facility]  . CESAREAN SECTION  1966  . MASTECTOMY Right 1996   By T. Davis  . PACEMAKER INSERTION  11/27/08   by Greggory Brandy (STM)  . TONSILLECTOMY      Allergies  Allergen Reactions  . Actonel [Risedronate Sodium]   . Aspirin   . Caffeine   . Calcium   . Codeine   . Evista [Raloxifene]   . Fosamax [Alendronate Sodium]   . Morphine   . Penicillins   . Prednisone   . Propoxyphene Hcl   . Propoxyphene N-Acetaminophen   . Sulfa Antibiotics       Medication List       Accurate as of 08/12/16 11:59 PM. Always use your most recent med list.          cholecalciferol 1000 units tablet Commonly known as:  VITAMIN D Take 1,000 Units by mouth. Take 2 daily   FISH OIL PO Take 1 tablet by mouth daily. Pt unsure of dosage   hydrocortisone 1 % ointment Apply 1 application topically 2 (two) times daily as needed for itching.   multivitamin with minerals Tabs tablet Take 1 tablet by mouth daily.   senna 8.6 MG tablet Commonly known as:  SENOKOT Take 1 tablet by mouth. Take one at  bedtime       Review of Systems  Constitutional: Negative for activity change, appetite change, chills, diaphoresis, fatigue, fever and unexpected weight change.       Breast cancer in 1996  HENT: Positive for hearing loss (Mild hearing loss). Negative for congestion, ear discharge, ear pain, postnasal drip, rhinorrhea, sore throat, tinnitus, trouble swallowing and voice change.   Eyes: Positive for visual disturbance (Corrective lenses). Negative for pain, redness and itching.  Respiratory: Negative for cough, choking, shortness of breath and wheezing.   Cardiovascular: Negative for chest pain, palpitations and leg swelling.  Gastrointestinal: Negative for abdominal distention, abdominal pain, constipation, diarrhea and  nausea.       Flatulence  Endocrine: Negative for cold intolerance, heat intolerance, polydipsia, polyphagia and polyuria.  Genitourinary: Negative for difficulty urinating, dysuria, flank pain, frequency, hematuria, pelvic pain, urgency and vaginal discharge.  Musculoskeletal: Negative for arthralgias, back pain, gait problem, myalgias, neck pain and neck stiffness.  Skin: Negative for color change, pallor and rash.       Chronic lichen planus with itching and sometimes a rash.  Allergic/Immunologic: Negative.   Neurological: Negative for dizziness, tremors, seizures, syncope, weakness, numbness and headaches.       Increasing memory loss and confusion  Hematological: Negative for adenopathy. Bruises/bleeds easily.  Psychiatric/Behavioral: Negative for agitation, behavioral problems, confusion, dysphoric mood, hallucinations, sleep disturbance and suicidal ideas. The patient is nervous/anxious. The patient is not hyperactive.     Immunization History  Administered Date(s) Administered  . Influenza-Unspecified 12/29/2014  . Pneumococcal-Unspecified 09/28/2008  . Tdap 08/03/2012   Pertinent  Health Maintenance Due  Topic Date Due  . DEXA SCAN  05/20/2002  . PNA vac Low Risk Adult (2 of 2 - PCV13) 09/28/2009  . INFLUENZA VACCINE  07/29/2016  . MAMMOGRAM  05/09/2017   Fall Risk  03/25/2016  Falls in the past year? No   Functional Status Survey:    Vitals:   08/12/16 1415  BP: 96/72  Pulse: 67  Resp: 20  Temp: 98.9 F (37.2 C)  Weight: 104 lb (47.2 kg)  Height: 5' 0.5" (1.537 m)   Body mass index is 19.98 kg/m. Physical Exam  Constitutional: She appears well-developed and well-nourished. No distress.  HENT:  Right Ear: External ear normal.  Left Ear: External ear normal.  Nose: Nose normal.  Mouth/Throat: Oropharynx is clear and moist. No oropharyngeal exudate.  Eyes: Conjunctivae and EOM are normal. Pupils are equal, round, and reactive to light. No scleral icterus.    Neck: No JVD present. No tracheal deviation present. No thyromegaly present.  Cardiovascular: Normal rate, regular rhythm and normal heart sounds.  Exam reveals no gallop and no friction rub.   No murmur heard. Pacemaker left upper chest wall. Diminished DP and PT bilaterally.  Pulmonary/Chest: Effort normal. No respiratory distress. She has no wheezes. She has no rales. She exhibits no tenderness.  Abdominal: She exhibits no distension and no mass. There is no tenderness.  Musculoskeletal: Normal range of motion. She exhibits no edema or tenderness.  Lymphadenopathy:    She has no cervical adenopathy.  Neurological: She is alert. No cranial nerve deficit. Coordination normal.  Mild memory loss  Skin: No rash noted. She is not diaphoretic. No erythema. No pallor.  Dry rash in localized areas consistent with lichen planus  Psychiatric: Her behavior is normal. Judgment and thought content normal.  Mild anxiety    Labs reviewed:  Recent Labs  06/30/16  NA 142  K 4.1  BUN  16  CREATININE 0.7    Recent Labs  06/30/16  AST 24  ALT 23  ALKPHOS 39   No results for input(s): WBC, NEUTROABS, HGB, HCT, MCV, PLT in the last 8760 hours. Lab Results  Component Value Date   TSH 1.53 04/11/2015   No results found for: HGBA1C No results found for: CHOL, HDL, LDLCALC, LDLDIRECT, TRIG, CHOLHDL  Significant Diagnostic Results in last 30 days:  No results found.  Assessment/Plan There are no diagnoses linked to this encounter.Atrial fibrillation Heart rate is in control, no rhythm agent.   Hyponatremia 06/30/16 Na 142, K 4.1, Bun 16, creat 0.7  Memory change Update MMSE, TSH, CBC  Pacemaker Functional.   Constipation Stable, continue Senokot I qhs     Family/ staff Communication: AL for care assistance  Labs/tests ordered:  CBC, TSH

## 2016-08-13 DIAGNOSIS — K5900 Constipation, unspecified: Secondary | ICD-10-CM | POA: Insufficient documentation

## 2016-08-13 NOTE — Assessment & Plan Note (Signed)
06/30/16 Na 142, K 4.1, Bun 16, creat 0.7

## 2016-08-13 NOTE — Assessment & Plan Note (Signed)
Stable, continue Senokot I qhs

## 2016-08-13 NOTE — Assessment & Plan Note (Signed)
Update MMSE, TSH, CBC

## 2016-08-13 NOTE — Assessment & Plan Note (Signed)
Functional 

## 2016-08-13 NOTE — Assessment & Plan Note (Signed)
Heart rate is in control, no rhythm agent.

## 2016-08-18 DIAGNOSIS — I10 Essential (primary) hypertension: Secondary | ICD-10-CM | POA: Diagnosis not present

## 2016-08-18 DIAGNOSIS — D649 Anemia, unspecified: Secondary | ICD-10-CM | POA: Diagnosis not present

## 2016-08-18 LAB — CBC AND DIFFERENTIAL
HEMATOCRIT: 41 % (ref 36–46)
HEMOGLOBIN: 13.8 g/dL (ref 12.0–16.0)
Platelets: 186 10*3/uL (ref 150–399)
WBC: 4.9 10^3/mL

## 2016-08-18 LAB — TSH: TSH: 2.47 u[IU]/mL (ref <=5.90)

## 2016-08-19 ENCOUNTER — Other Ambulatory Visit: Payer: Self-pay | Admitting: *Deleted

## 2016-08-25 DIAGNOSIS — I639 Cerebral infarction, unspecified: Secondary | ICD-10-CM | POA: Diagnosis not present

## 2016-08-25 DIAGNOSIS — E785 Hyperlipidemia, unspecified: Secondary | ICD-10-CM | POA: Diagnosis not present

## 2016-08-25 DIAGNOSIS — M79671 Pain in right foot: Secondary | ICD-10-CM | POA: Diagnosis not present

## 2016-08-25 DIAGNOSIS — I69020 Aphasia following nontraumatic subarachnoid hemorrhage: Secondary | ICD-10-CM | POA: Diagnosis not present

## 2016-08-25 DIAGNOSIS — M79604 Pain in right leg: Secondary | ICD-10-CM | POA: Diagnosis not present

## 2016-08-25 DIAGNOSIS — H8113 Benign paroxysmal vertigo, bilateral: Secondary | ICD-10-CM | POA: Diagnosis not present

## 2016-08-25 DIAGNOSIS — R262 Difficulty in walking, not elsewhere classified: Secondary | ICD-10-CM | POA: Diagnosis not present

## 2016-08-25 DIAGNOSIS — Z950 Presence of cardiac pacemaker: Secondary | ICD-10-CM | POA: Diagnosis not present

## 2016-08-25 DIAGNOSIS — R4182 Altered mental status, unspecified: Secondary | ICD-10-CM | POA: Diagnosis not present

## 2016-08-26 DIAGNOSIS — R262 Difficulty in walking, not elsewhere classified: Secondary | ICD-10-CM | POA: Diagnosis not present

## 2016-08-26 DIAGNOSIS — I639 Cerebral infarction, unspecified: Secondary | ICD-10-CM | POA: Diagnosis not present

## 2016-08-26 DIAGNOSIS — E785 Hyperlipidemia, unspecified: Secondary | ICD-10-CM | POA: Diagnosis not present

## 2016-08-26 DIAGNOSIS — M79604 Pain in right leg: Secondary | ICD-10-CM | POA: Diagnosis not present

## 2016-08-26 DIAGNOSIS — H8113 Benign paroxysmal vertigo, bilateral: Secondary | ICD-10-CM | POA: Diagnosis not present

## 2016-08-26 DIAGNOSIS — M79671 Pain in right foot: Secondary | ICD-10-CM | POA: Diagnosis not present

## 2016-08-28 DIAGNOSIS — R262 Difficulty in walking, not elsewhere classified: Secondary | ICD-10-CM | POA: Diagnosis not present

## 2016-08-28 DIAGNOSIS — M79671 Pain in right foot: Secondary | ICD-10-CM | POA: Diagnosis not present

## 2016-08-28 DIAGNOSIS — I639 Cerebral infarction, unspecified: Secondary | ICD-10-CM | POA: Diagnosis not present

## 2016-08-28 DIAGNOSIS — H8113 Benign paroxysmal vertigo, bilateral: Secondary | ICD-10-CM | POA: Diagnosis not present

## 2016-08-28 DIAGNOSIS — M79604 Pain in right leg: Secondary | ICD-10-CM | POA: Diagnosis not present

## 2016-08-28 DIAGNOSIS — E785 Hyperlipidemia, unspecified: Secondary | ICD-10-CM | POA: Diagnosis not present

## 2016-09-01 DIAGNOSIS — E785 Hyperlipidemia, unspecified: Secondary | ICD-10-CM | POA: Diagnosis not present

## 2016-09-01 DIAGNOSIS — Z950 Presence of cardiac pacemaker: Secondary | ICD-10-CM | POA: Diagnosis not present

## 2016-09-01 DIAGNOSIS — I69020 Aphasia following nontraumatic subarachnoid hemorrhage: Secondary | ICD-10-CM | POA: Diagnosis not present

## 2016-09-01 DIAGNOSIS — R4182 Altered mental status, unspecified: Secondary | ICD-10-CM | POA: Diagnosis not present

## 2016-09-01 DIAGNOSIS — I639 Cerebral infarction, unspecified: Secondary | ICD-10-CM | POA: Diagnosis not present

## 2016-09-01 DIAGNOSIS — M6281 Muscle weakness (generalized): Secondary | ICD-10-CM | POA: Diagnosis not present

## 2016-09-01 DIAGNOSIS — H8113 Benign paroxysmal vertigo, bilateral: Secondary | ICD-10-CM | POA: Diagnosis not present

## 2016-09-01 DIAGNOSIS — R262 Difficulty in walking, not elsewhere classified: Secondary | ICD-10-CM | POA: Diagnosis not present

## 2016-09-03 DIAGNOSIS — E785 Hyperlipidemia, unspecified: Secondary | ICD-10-CM | POA: Diagnosis not present

## 2016-09-03 DIAGNOSIS — R262 Difficulty in walking, not elsewhere classified: Secondary | ICD-10-CM | POA: Diagnosis not present

## 2016-09-03 DIAGNOSIS — H8113 Benign paroxysmal vertigo, bilateral: Secondary | ICD-10-CM | POA: Diagnosis not present

## 2016-09-03 DIAGNOSIS — M6281 Muscle weakness (generalized): Secondary | ICD-10-CM | POA: Diagnosis not present

## 2016-09-03 DIAGNOSIS — I639 Cerebral infarction, unspecified: Secondary | ICD-10-CM | POA: Diagnosis not present

## 2016-09-03 DIAGNOSIS — Z950 Presence of cardiac pacemaker: Secondary | ICD-10-CM | POA: Diagnosis not present

## 2016-09-04 DIAGNOSIS — I639 Cerebral infarction, unspecified: Secondary | ICD-10-CM | POA: Diagnosis not present

## 2016-09-04 DIAGNOSIS — H8113 Benign paroxysmal vertigo, bilateral: Secondary | ICD-10-CM | POA: Diagnosis not present

## 2016-09-04 DIAGNOSIS — Z950 Presence of cardiac pacemaker: Secondary | ICD-10-CM | POA: Diagnosis not present

## 2016-09-04 DIAGNOSIS — R262 Difficulty in walking, not elsewhere classified: Secondary | ICD-10-CM | POA: Diagnosis not present

## 2016-09-04 DIAGNOSIS — M6281 Muscle weakness (generalized): Secondary | ICD-10-CM | POA: Diagnosis not present

## 2016-09-04 DIAGNOSIS — E785 Hyperlipidemia, unspecified: Secondary | ICD-10-CM | POA: Diagnosis not present

## 2016-09-08 ENCOUNTER — Non-Acute Institutional Stay: Payer: Medicare Other | Admitting: Internal Medicine

## 2016-09-08 ENCOUNTER — Encounter: Payer: Self-pay | Admitting: Internal Medicine

## 2016-09-08 DIAGNOSIS — R262 Difficulty in walking, not elsewhere classified: Secondary | ICD-10-CM | POA: Diagnosis not present

## 2016-09-08 DIAGNOSIS — E785 Hyperlipidemia, unspecified: Secondary | ICD-10-CM | POA: Diagnosis not present

## 2016-09-08 DIAGNOSIS — I10 Essential (primary) hypertension: Secondary | ICD-10-CM | POA: Diagnosis not present

## 2016-09-08 DIAGNOSIS — Z950 Presence of cardiac pacemaker: Secondary | ICD-10-CM | POA: Diagnosis not present

## 2016-09-08 DIAGNOSIS — J209 Acute bronchitis, unspecified: Secondary | ICD-10-CM | POA: Diagnosis not present

## 2016-09-08 DIAGNOSIS — M6281 Muscle weakness (generalized): Secondary | ICD-10-CM | POA: Diagnosis not present

## 2016-09-08 DIAGNOSIS — D649 Anemia, unspecified: Secondary | ICD-10-CM | POA: Diagnosis not present

## 2016-09-08 DIAGNOSIS — H8113 Benign paroxysmal vertigo, bilateral: Secondary | ICD-10-CM | POA: Diagnosis not present

## 2016-09-08 DIAGNOSIS — I639 Cerebral infarction, unspecified: Secondary | ICD-10-CM | POA: Diagnosis not present

## 2016-09-08 LAB — CBC AND DIFFERENTIAL
HEMATOCRIT: 40 % (ref 36–46)
Hemoglobin: 13.5 g/dL (ref 12.0–16.0)
PLATELETS: 164 10*3/uL (ref 150–399)
WBC: 6.7 10^3/mL

## 2016-09-08 LAB — BASIC METABOLIC PANEL
BUN: 11 mg/dL (ref 4–21)
Creatinine: 0.8 mg/dL (ref <=1.1)
Glucose: 129 mg/dL
Potassium: 4.8 mmol/L (ref 3.4–5.3)
Sodium: 137 mmol/L (ref 137–147)

## 2016-09-08 LAB — HEPATIC FUNCTION PANEL
ALK PHOS: 47 U/L (ref 25–125)
ALT: 21 U/L (ref 7–35)
AST: 24 U/L (ref 13–35)
BILIRUBIN, TOTAL: 0.5 mg/dL

## 2016-09-08 NOTE — Progress Notes (Signed)
Progress Note    Location:   [place] Room Number: [place] of Service:  ALF [phone]) Provider:  [name], MD  Patient Care Team: [name], MD as PCP - General (Internal Medicine) Man Otho Darner, NP as Nurse Practitioner (Internal Medicine)  Extended Emergency Contact Information Primary Emergency Contact: [facility] Address: [place] [address] [name] of [place] Phone: [phone] Work Phone: [phone] Mobile Phone: [phone] Relation: Son Secondary Emergency Contact: Melida Quitter States of [place] Phone: [phone] Relation: Daughter  Code Status:  DNR Goals of care: Advanced Directive information Advanced Directives 09/08/2016  Does patient have an advance directive? Yes  Type of Paramedic of [place];Living will;Out of facility DNR (pink MOST or yellow form)  Does patient want to make changes to advanced directive? -  Copy of advanced directive(s) in chart? Yes  Pre-existing out of facility DNR order (yellow form or pink MOST form) -     Chief Complaint  Patient presents with  . Medical Management of Chronic Issues    routine  . Cough    has gotten worse over the weekend    HPI:  Pt is a 81 y.o. female seen today for acute visit to evaluate cough and generalized malaise. Fatigue. Sinus congestion and sore throat. She has not been running a fever. She feels exhausted;  "not myself".   Past Medical History:  Diagnosis Date  . Breast cancer ([place]) 1996   right side  . Dementia    forgetful  . FH: mastectomy    right side   . Hyponatremia   . Lichen planus   . Osteoporosis   . Pacemaker   . Sinus node dysfunction (HCC)    with syncope; status post implantation of a St. Jude Medical Zephyr XL model 5826 dual-chamber pacemaker  November 27, 2008.  . Subarachnoid hemorrhage following injury ([place])    right frontal, following  traumatic brain injury   Past Surgical History:  Procedure Laterality Date  . ABDOMINAL HYSTERECTOMY    . APPENDECTOMY    . BREAST ENHANCEMENT SURGERY Right 1997   By [place]  . CESAREAN SECTION  1966  . MASTECTOMY Right 1996   By T. Davis  . PACEMAKER INSERTION  11/27/08   by Greggory Brandy (STM)  . TONSILLECTOMY      Allergies  Allergen Reactions  . Actonel [Risedronate Sodium]   . Aspirin   . Caffeine   . Calcium   . Codeine   . Evista [Raloxifene]   . Fosamax [Alendronate Sodium]   . Morphine   . Penicillins   . Prednisone   . Propoxyphene Hcl   . Propoxyphene N-Acetaminophen   . Sulfa Antibiotics       Medication List       Accurate as of 09/08/16  9:59 AM. Always use your most recent med list.          ASPERCREME W/LIDOCAINE 4 % cream Generic drug:  lidocaine Apply 1 application topically. Apply to right ankle and RLE four times a day as needed for pain   cholecalciferol 1000 units tablet Commonly known as:  VITAMIN D Take 1,000 Units by mouth. Take 2 daily   FISH OIL PO Take 1 tablet by mouth daily. Pt unsure of dosage   hydrocortisone 1 % ointment Apply 1 application topically 2 (two) times daily as needed for itching.  multivitamin with minerals Tabs tablet Take 1 tablet by mouth daily.   senna 8.6 MG tablet Commonly known as:  SENOKOT Take 1 tablet by mouth. Take one at bedtime       Review of Systems  Constitutional: Positive for fatigue. Negative for activity change, appetite change, chills, diaphoresis, fever and unexpected weight change.       Breast cancer in 1996  HENT: Positive for hearing loss (Mild hearing loss), sinus pressure and sore throat. Negative for congestion, ear discharge, ear pain, postnasal drip, rhinorrhea, tinnitus, trouble swallowing and voice change.   Eyes: Positive for visual disturbance (Corrective lenses). Negative for pain, redness and itching.  Respiratory: Positive for cough (bronchiall rattle). Negative for  choking, shortness of breath and wheezing.   Cardiovascular: Negative for chest pain, palpitations and leg swelling.  Gastrointestinal: Negative for abdominal distention, abdominal pain, constipation, diarrhea and nausea.       Flatulence  Endocrine: Negative for cold intolerance, heat intolerance, polydipsia, polyphagia and polyuria.  Genitourinary: Negative for difficulty urinating, dysuria, flank pain, frequency, hematuria, pelvic pain, urgency and vaginal discharge.  Musculoskeletal: Negative for arthralgias, back pain, gait problem, myalgias, neck pain and neck stiffness.  Skin: Negative for color change, pallor and rash.       Chronic lichen planus with itching and sometimes a rash.  Allergic/Immunologic: Negative.   Neurological: Negative for dizziness, tremors, seizures, syncope, weakness, numbness and headaches.       Increasing memory loss and confusion  Hematological: Negative for adenopathy. Bruises/bleeds easily.  Psychiatric/Behavioral: Negative for agitation, behavioral problems, confusion, dysphoric mood, hallucinations, sleep disturbance and suicidal ideas. The patient is nervous/anxious. The patient is not hyperactive.     Immunization History  Administered Date(s) Administered  . Influenza-Unspecified 12/29/2014  . Pneumococcal-Unspecified 09/28/2008  . Tdap 08/03/2012   Pertinent  Health Maintenance Due  Topic Date Due  . DEXA SCAN  05/20/2002  . PNA vac Low Risk Adult (2 of 2 - PCV13) 09/28/2009  . INFLUENZA VACCINE  07/29/2016  . MAMMOGRAM  05/09/2017   Fall Risk  03/25/2016  Falls in the past year? No   Functional Status Survey:    Vitals:   09/08/16 0949  BP: 134/90  Pulse: 67  Resp: 18  Temp: 98 F (36.7 C)  SpO2: 95%  Weight: 105 lb (47.6 kg)  Height: 5' 0.05" (1.525 m)   Body mass index is 20.47 kg/m. Physical Exam  Constitutional: She appears well-developed and well-nourished. No distress.  Slightly flushed  HENT:  Right Ear: External ear  normal.  Left Ear: External ear normal.  Nose: Nose normal.  Mouth/Throat: Oropharynx is clear and moist. No oropharyngeal exudate.  Eyes: Conjunctivae and EOM are normal. Pupils are equal, round, and reactive to light. No scleral icterus.  Neck: No JVD present. No tracheal deviation present. No thyromegaly present.  Cardiovascular: Normal rate, regular rhythm and normal heart sounds.  Exam reveals no gallop and no friction rub.   No murmur heard. Pacemaker left upper chest wall. Diminished DP and PT bilaterally.  Pulmonary/Chest: Effort normal. No respiratory distress. She has no wheezes. She has rales. She exhibits no tenderness.  Coughing. Bronchial rattle.  Abdominal: She exhibits no distension and no mass. There is no tenderness.  Musculoskeletal: Normal range of motion. She exhibits no edema or tenderness.  Lymphadenopathy:    She has no cervical adenopathy.  Neurological: She is alert. No cranial nerve deficit. Coordination normal.  Mild memory loss  Skin: No rash noted. She is not  diaphoretic. No erythema. No pallor.  Dry rash in localized areas consistent with lichen planus  Psychiatric: Her behavior is normal. Judgment and thought content normal.  Mild anxiety    Labs reviewed:  Recent Labs  06/30/16  NA 142  K 4.1  BUN 16  CREATININE 0.7    Recent Labs  06/30/16  AST 24  ALT 23  ALKPHOS 39    Recent Labs  08/18/16  WBC 4.9  HGB 13.8  HCT 41  PLT 186   Lab Results  Component Value Date   TSH 2.47 08/18/2016    Assessment/Plan 1. Acute bronchitis, unspecified organism -Start azithromycin 500 mg daily 3 days -Start Mucinex DM twice daily 7 days -Follow-up CBC and CMP

## 2016-09-10 DIAGNOSIS — Z950 Presence of cardiac pacemaker: Secondary | ICD-10-CM | POA: Diagnosis not present

## 2016-09-10 DIAGNOSIS — H8113 Benign paroxysmal vertigo, bilateral: Secondary | ICD-10-CM | POA: Diagnosis not present

## 2016-09-10 DIAGNOSIS — M6281 Muscle weakness (generalized): Secondary | ICD-10-CM | POA: Diagnosis not present

## 2016-09-10 DIAGNOSIS — E785 Hyperlipidemia, unspecified: Secondary | ICD-10-CM | POA: Diagnosis not present

## 2016-09-10 DIAGNOSIS — I639 Cerebral infarction, unspecified: Secondary | ICD-10-CM | POA: Diagnosis not present

## 2016-09-10 DIAGNOSIS — R262 Difficulty in walking, not elsewhere classified: Secondary | ICD-10-CM | POA: Diagnosis not present

## 2016-09-11 DIAGNOSIS — R262 Difficulty in walking, not elsewhere classified: Secondary | ICD-10-CM | POA: Diagnosis not present

## 2016-09-11 DIAGNOSIS — E785 Hyperlipidemia, unspecified: Secondary | ICD-10-CM | POA: Diagnosis not present

## 2016-09-11 DIAGNOSIS — H8113 Benign paroxysmal vertigo, bilateral: Secondary | ICD-10-CM | POA: Diagnosis not present

## 2016-09-11 DIAGNOSIS — M6281 Muscle weakness (generalized): Secondary | ICD-10-CM | POA: Diagnosis not present

## 2016-09-11 DIAGNOSIS — I639 Cerebral infarction, unspecified: Secondary | ICD-10-CM | POA: Diagnosis not present

## 2016-09-11 DIAGNOSIS — Z950 Presence of cardiac pacemaker: Secondary | ICD-10-CM | POA: Diagnosis not present

## 2016-09-16 ENCOUNTER — Encounter: Payer: Self-pay | Admitting: *Deleted

## 2016-09-16 ENCOUNTER — Non-Acute Institutional Stay: Payer: Medicare Other | Admitting: Nurse Practitioner

## 2016-09-16 DIAGNOSIS — J209 Acute bronchitis, unspecified: Secondary | ICD-10-CM

## 2016-09-16 DIAGNOSIS — H8113 Benign paroxysmal vertigo, bilateral: Secondary | ICD-10-CM | POA: Diagnosis not present

## 2016-09-16 DIAGNOSIS — R413 Other amnesia: Secondary | ICD-10-CM | POA: Diagnosis not present

## 2016-09-16 DIAGNOSIS — K5900 Constipation, unspecified: Secondary | ICD-10-CM

## 2016-09-16 DIAGNOSIS — R262 Difficulty in walking, not elsewhere classified: Secondary | ICD-10-CM | POA: Diagnosis not present

## 2016-09-16 DIAGNOSIS — E871 Hypo-osmolality and hyponatremia: Secondary | ICD-10-CM

## 2016-09-16 DIAGNOSIS — Z950 Presence of cardiac pacemaker: Secondary | ICD-10-CM | POA: Diagnosis not present

## 2016-09-16 DIAGNOSIS — I639 Cerebral infarction, unspecified: Secondary | ICD-10-CM | POA: Diagnosis not present

## 2016-09-16 DIAGNOSIS — M6281 Muscle weakness (generalized): Secondary | ICD-10-CM | POA: Diagnosis not present

## 2016-09-16 DIAGNOSIS — E785 Hyperlipidemia, unspecified: Secondary | ICD-10-CM | POA: Diagnosis not present

## 2016-09-16 NOTE — Assessment & Plan Note (Signed)
Stable, continue Senokot I qhs

## 2016-09-16 NOTE — Assessment & Plan Note (Signed)
Heart rate is in control, no rhythm agent.

## 2016-09-16 NOTE — Progress Notes (Signed)
Location:  [place] Room Number: 28 Place of Service:  ALF [phone]) Provider:  Daneille Desilva, [place]  NP  [name], MD  Patient Care Team: [name], MD as PCP - General (Internal Medicine) Meylin Stenzel Otho Darner, NP as Nurse Practitioner (Internal Medicine)  Extended Emergency Contact Information Primary Emergency Contact: [facility] Address: [place] [address] [name] of [place] Phone: [phone] Work Phone: [phone] Mobile Phone: [phone] Relation: Son Secondary Emergency Contact: Melida Quitter [place] of [place] Phone: [phone] Relation: Daughter  Code Status:  DNR Goals of care: Advanced Directive information Advanced Directives 09/16/2016  Does patient have an advance directive? Yes  Type of Paramedic of [place];Living will;Out of facility DNR (pink MOST or yellow form)  Does patient want to make changes to advanced directive? No - Patient declined  Copy of advanced directive(s) in chart? Yes  Pre-existing out of facility DNR order (yellow form or pink MOST form) -     Chief Complaint  Patient presents with  . Acute Visit    still has cough and congestion, dizzness, anxiety.    HPI:  Pt is a 81 y.o. female seen today for an acute visit for persisted congestive cough, s/p Zpk, afebrile, feeling anxious, pacing. Denied chest pain, no O2 desaturation.   Hx of Afib, heart rate is in control, not on meds, s/p pacemaker, constipation, stable, taking Senokot I daily. Resides in [place] for care assistance.    Past Medical History:  Diagnosis Date  . Breast cancer ([place]) 1996   right side  . Dementia    forgetful  . FH: mastectomy    right side   . Hyponatremia   . Lichen planus   . Osteoporosis   . Pacemaker   . Sinus node dysfunction (HCC)    with syncope; status post implantation of a St. Jude Medical Zephyr XL model 5826 dual-chamber pacemaker   November 27, 2008.  . Subarachnoid hemorrhage following injury ([place])    right frontal, following traumatic brain injury   Past Surgical History:  Procedure Laterality Date  . ABDOMINAL HYSTERECTOMY    . APPENDECTOMY    . BREAST ENHANCEMENT SURGERY Right 1997   By [place] [facility]  . CESAREAN SECTION  1966  . MASTECTOMY Right 1996   By T. Davis  . PACEMAKER INSERTION  11/27/08   by Greggory Brandy (STM)  . TONSILLECTOMY      Allergies  Allergen Reactions  . Actonel [Risedronate Sodium]   . Aspirin   . Caffeine   . Calcium   . Codeine   . Evista [Raloxifene]   . Fosamax [Alendronate Sodium]   . Morphine   . Penicillins   . Prednisone   . Propoxyphene Hcl   . Propoxyphene N-Acetaminophen   . Sulfa Antibiotics       Medication List       Accurate as of 09/16/16  3:10 PM. Always use your most recent med list.          ASPERCREME W/LIDOCAINE 4 % cream Generic drug:  lidocaine Apply 1 application topically. Apply to right ankle and RLE four times a day as needed for pain   cholecalciferol 1000 units tablet Commonly known as:  VITAMIN D Take 1,000 Units by mouth. Take 2 daily   FISH OIL PO Take 1 tablet by mouth daily. Pt unsure of dosage   hydrocortisone 1 % ointment Apply 1  application topically 2 (two) times daily as needed for itching.   multivitamin with minerals Tabs tablet Take 1 tablet by mouth daily.   senna 8.6 MG tablet Commonly known as:  SENOKOT Take 1 tablet by mouth. Take one at bedtime       Review of Systems  Constitutional: Positive for fatigue. Negative for activity change, appetite change, chills, diaphoresis, fever and unexpected weight change.       Breast cancer in 1996  HENT: Positive for hearing loss (Mild hearing loss), sinus pressure and sore throat. Negative for congestion, ear discharge, ear pain, postnasal drip, rhinorrhea, tinnitus, trouble swallowing and voice change.   Eyes: Positive for visual disturbance (Corrective lenses). Negative for  pain, redness and itching.  Respiratory: Positive for cough (bronchiall rattle). Negative for choking, shortness of breath and wheezing.   Cardiovascular: Negative for chest pain, palpitations and leg swelling.  Gastrointestinal: Negative for abdominal distention, abdominal pain, constipation, diarrhea and nausea.       Flatulence  Endocrine: Negative for cold intolerance, heat intolerance, polydipsia, polyphagia and polyuria.  Genitourinary: Negative for difficulty urinating, dysuria, flank pain, frequency, hematuria, pelvic pain, urgency and vaginal discharge.  Musculoskeletal: Negative for arthralgias, back pain, gait problem, myalgias, neck pain and neck stiffness.  Skin: Negative for color change, pallor and rash.       Chronic lichen planus with itching and sometimes a rash.  Allergic/Immunologic: Negative.   Neurological: Negative for dizziness, tremors, seizures, syncope, weakness, numbness and headaches.       Increasing memory loss and confusion  Hematological: Negative for adenopathy. Bruises/bleeds easily.  Psychiatric/Behavioral: Negative for agitation, behavioral problems, confusion, dysphoric mood, hallucinations, sleep disturbance and suicidal ideas. The patient is nervous/anxious. The patient is not hyperactive.     Immunization History  Administered Date(s) Administered  . Influenza-Unspecified 12/29/2014  . Pneumococcal-Unspecified 09/28/2008  . Tdap 08/03/2012   Pertinent  Health Maintenance Due  Topic Date Due  . DEXA SCAN  05/20/2002  . PNA vac Low Risk Adult (2 of 2 - PCV13) 09/28/2009  . INFLUENZA VACCINE  07/29/2016  . MAMMOGRAM  05/09/2017   Fall Risk  03/25/2016  Falls in the past year? No   Functional Status Survey:    Vitals:   09/16/16 1330  BP: 115/80  Pulse: 67  Resp: 18  Temp: 98.5 F (36.9 C)  SpO2: 92%  Weight: 105 lb (47.6 kg)  Height: 5' 0.5" (1.537 m)   Body mass index is 20.17 kg/m. Physical Exam  Constitutional: She appears  well-developed and well-nourished. No distress.  Slightly flushed  HENT:  Right Ear: External ear normal.  Left Ear: External ear normal.  Nose: Nose normal.  Mouth/Throat: Oropharynx is clear and moist. No oropharyngeal exudate.  Eyes: Conjunctivae and EOM are normal. Pupils are equal, round, and reactive to light. No scleral icterus.  Neck: No JVD present. No tracheal deviation present. No thyromegaly present.  Cardiovascular: Normal rate, regular rhythm and normal heart sounds.  Exam reveals no gallop and no friction rub.   No murmur heard. Pacemaker left upper chest wall. Diminished DP and PT bilaterally.  Pulmonary/Chest: Effort normal. No respiratory distress. She has no wheezes. She has rales. She exhibits no tenderness.  Coughing. Bronchial rattle.  Abdominal: She exhibits no distension and no mass. There is no tenderness.  Musculoskeletal: Normal range of motion. She exhibits no edema or tenderness.  Lymphadenopathy:    She has no cervical adenopathy.  Neurological: She is alert. No cranial nerve deficit. Coordination normal.  Mild memory loss  Skin: No rash noted. She is not diaphoretic. No erythema. No pallor.  Dry rash in localized areas consistent with lichen planus  Psychiatric: Her behavior is normal. Judgment and thought content normal.  Mild anxiety    Labs reviewed:  Recent Labs  06/30/16 09/08/16  NA 142 137  K 4.1 4.8  BUN 16 11  CREATININE 0.7 0.8    Recent Labs  06/30/16 09/08/16  AST 24 24  ALT 23 21  ALKPHOS 39 47    Recent Labs  08/18/16 09/08/16  WBC 4.9 6.7  HGB 13.8 13.5  HCT 41 40  PLT 186 164   Lab Results  Component Value Date   TSH 2.47 08/18/2016   No results found for: HGBA1C No results found for: CHOL, HDL, LDLCALC, LDLDIRECT, TRIG, CHOLHDL  Significant Diagnostic Results in last 30 days:  No results found.  Assessment/Plan There are no diagnoses linked to this encounter.Atrial fibrillation Heart rate is in control, no  rhythm agent.    Acute bronchitis S/p Zpk, persisted congestive cough, afebrile, denied chest pain, will obtain CXR to r/o PNA, adding Medrol dose pk, Mucinex bid x 3 days, observe the patient.   Constipation Stable, continue Senokot I qhs   Hyponatremia 06/30/16 Na 142, K 4.1, Bun 16, creat 0.7, update CBC and BMP  Memory change AL for care assistance.      Family/ staff Communication: continue AL for care assistance.   Labs/tests ordered:  CBC, BMP, CXR

## 2016-09-16 NOTE — Assessment & Plan Note (Signed)
06/30/16 Na 142, K 4.1, Bun 16, creat 0.7, update CBC and BMP

## 2016-09-16 NOTE — Assessment & Plan Note (Signed)
AL for care assistance.   

## 2016-09-16 NOTE — Assessment & Plan Note (Signed)
S/p Zpk, persisted congestive cough, afebrile, denied chest pain, will obtain CXR to r/o PNA, adding Medrol dose pk, Mucinex bid x 3 days, observe the patient.

## 2016-09-17 DIAGNOSIS — R05 Cough: Secondary | ICD-10-CM | POA: Diagnosis not present

## 2016-09-18 ENCOUNTER — Encounter: Payer: Self-pay | Admitting: Nurse Practitioner

## 2016-09-18 DIAGNOSIS — M6281 Muscle weakness (generalized): Secondary | ICD-10-CM | POA: Diagnosis not present

## 2016-09-18 DIAGNOSIS — I10 Essential (primary) hypertension: Secondary | ICD-10-CM | POA: Diagnosis not present

## 2016-09-18 DIAGNOSIS — Z950 Presence of cardiac pacemaker: Secondary | ICD-10-CM | POA: Diagnosis not present

## 2016-09-18 DIAGNOSIS — I639 Cerebral infarction, unspecified: Secondary | ICD-10-CM | POA: Diagnosis not present

## 2016-09-18 DIAGNOSIS — D649 Anemia, unspecified: Secondary | ICD-10-CM | POA: Diagnosis not present

## 2016-09-18 DIAGNOSIS — E785 Hyperlipidemia, unspecified: Secondary | ICD-10-CM | POA: Diagnosis not present

## 2016-09-18 DIAGNOSIS — R262 Difficulty in walking, not elsewhere classified: Secondary | ICD-10-CM | POA: Diagnosis not present

## 2016-09-18 DIAGNOSIS — H8113 Benign paroxysmal vertigo, bilateral: Secondary | ICD-10-CM | POA: Diagnosis not present

## 2016-09-18 DIAGNOSIS — F429 Obsessive-compulsive disorder, unspecified: Secondary | ICD-10-CM | POA: Insufficient documentation

## 2016-09-18 LAB — BASIC METABOLIC PANEL
BUN: 16 mg/dL (ref 4–21)
Creatinine: 0.7 mg/dL (ref <=1.1)
GLUCOSE: 92 mg/dL
POTASSIUM: 3.5 mmol/L (ref 3.4–5.3)
SODIUM: 139 mmol/L (ref 137–147)

## 2016-09-18 LAB — CBC AND DIFFERENTIAL
HEMATOCRIT: 42 % (ref 36–46)
HEMOGLOBIN: 14.3 g/dL (ref 12.0–16.0)
Platelets: 249 10*3/uL (ref 150–399)
WBC: 9.1 10^3/mL

## 2016-09-22 DIAGNOSIS — M6281 Muscle weakness (generalized): Secondary | ICD-10-CM | POA: Diagnosis not present

## 2016-09-22 DIAGNOSIS — R262 Difficulty in walking, not elsewhere classified: Secondary | ICD-10-CM | POA: Diagnosis not present

## 2016-09-22 DIAGNOSIS — H8113 Benign paroxysmal vertigo, bilateral: Secondary | ICD-10-CM | POA: Diagnosis not present

## 2016-09-22 DIAGNOSIS — E785 Hyperlipidemia, unspecified: Secondary | ICD-10-CM | POA: Diagnosis not present

## 2016-09-22 DIAGNOSIS — I639 Cerebral infarction, unspecified: Secondary | ICD-10-CM | POA: Diagnosis not present

## 2016-09-22 DIAGNOSIS — Z950 Presence of cardiac pacemaker: Secondary | ICD-10-CM | POA: Diagnosis not present

## 2016-09-23 ENCOUNTER — Other Ambulatory Visit: Payer: Self-pay | Admitting: *Deleted

## 2016-09-23 DIAGNOSIS — H8113 Benign paroxysmal vertigo, bilateral: Secondary | ICD-10-CM | POA: Diagnosis not present

## 2016-09-23 DIAGNOSIS — E785 Hyperlipidemia, unspecified: Secondary | ICD-10-CM | POA: Diagnosis not present

## 2016-09-23 DIAGNOSIS — M6281 Muscle weakness (generalized): Secondary | ICD-10-CM | POA: Diagnosis not present

## 2016-09-23 DIAGNOSIS — I639 Cerebral infarction, unspecified: Secondary | ICD-10-CM | POA: Diagnosis not present

## 2016-09-23 DIAGNOSIS — Z950 Presence of cardiac pacemaker: Secondary | ICD-10-CM | POA: Diagnosis not present

## 2016-09-23 DIAGNOSIS — R262 Difficulty in walking, not elsewhere classified: Secondary | ICD-10-CM | POA: Diagnosis not present

## 2016-09-26 DIAGNOSIS — I639 Cerebral infarction, unspecified: Secondary | ICD-10-CM | POA: Diagnosis not present

## 2016-09-26 DIAGNOSIS — E785 Hyperlipidemia, unspecified: Secondary | ICD-10-CM | POA: Diagnosis not present

## 2016-09-26 DIAGNOSIS — R262 Difficulty in walking, not elsewhere classified: Secondary | ICD-10-CM | POA: Diagnosis not present

## 2016-09-26 DIAGNOSIS — H8113 Benign paroxysmal vertigo, bilateral: Secondary | ICD-10-CM | POA: Diagnosis not present

## 2016-09-26 DIAGNOSIS — Z950 Presence of cardiac pacemaker: Secondary | ICD-10-CM | POA: Diagnosis not present

## 2016-09-26 DIAGNOSIS — M6281 Muscle weakness (generalized): Secondary | ICD-10-CM | POA: Diagnosis not present

## 2016-10-02 DIAGNOSIS — F429 Obsessive-compulsive disorder, unspecified: Secondary | ICD-10-CM | POA: Diagnosis not present

## 2016-10-07 ENCOUNTER — Encounter: Payer: Self-pay | Admitting: Nurse Practitioner

## 2016-10-07 ENCOUNTER — Non-Acute Institutional Stay: Payer: Medicare Other | Admitting: Nurse Practitioner

## 2016-10-07 DIAGNOSIS — K5900 Constipation, unspecified: Secondary | ICD-10-CM

## 2016-10-07 DIAGNOSIS — R413 Other amnesia: Secondary | ICD-10-CM

## 2016-10-07 DIAGNOSIS — F422 Mixed obsessional thoughts and acts: Secondary | ICD-10-CM

## 2016-10-07 DIAGNOSIS — K219 Gastro-esophageal reflux disease without esophagitis: Secondary | ICD-10-CM | POA: Diagnosis not present

## 2016-10-07 DIAGNOSIS — I480 Paroxysmal atrial fibrillation: Secondary | ICD-10-CM

## 2016-10-07 DIAGNOSIS — J209 Acute bronchitis, unspecified: Secondary | ICD-10-CM

## 2016-10-07 NOTE — Assessment & Plan Note (Signed)
Heart rate is in control, no rhythm agent.

## 2016-10-07 NOTE — Progress Notes (Signed)
Location:  [name] Room Number: 28 Place of Service:  ALF [phone]) Provider:  [name], Manxie  NP  [name], MD  Patient Care Team: Elaine Dooms, MD as PCP - General (Internal Medicine) Elaine Frances Otho Darner, NP as Nurse Practitioner (Internal Medicine)  Extended Emergency Contact Information Primary Emergency Contact: [name] Address: [address] [address] [name] Phone: [phone] Work Phone: [phone] Mobile Phone: [phone] Relation: Son Secondary Emergency Contact: [name] [place] of [place] Phone: [phone] Relation: Daughter  Code Status:  DNR Goals of care: Advanced Directive information Advanced Directives 10/07/2016  Does patient have an advance directive? Yes  Type of Paramedic of [place];Living will;Out of facility DNR (pink MOST or yellow form)  Does patient want to make changes to advanced directive? No - Patient declined  Copy of advanced directive(s) in chart? Yes  Pre-existing out of facility DNR order (yellow form or pink MOST form) -     Chief Complaint  Patient presents with  . Acute Visit    ? upper respiratory,    HPI:  Pt is a 81 y.o. female seen today for an acute visit for cough and raspy voice comes and goes, she stated it related to her eating at times, denied chest pain or phlegm production, afebrile, no O2 desaturation. Treated as acute bronchitis 09/16/16 w/o significant improvement.     Hx of Afib, heart rate is in control, not on meds, s/p pacemaker, constipation, stable, taking Senokot I daily. Resides in AL for care assistance.     Past Medical History:  Diagnosis Date  . Breast cancer ([place]) 1996   right side  . Dementia    forgetful  . FH: mastectomy    right side   . Hyponatremia   . Lichen planus   . Osteoporosis   . Pacemaker   . Sinus node dysfunction ([facility])    with syncope; status post  implantation of a [employer] Zephyr XL model 5826 dual-chamber pacemaker  November 27, 2008.  . Subarachnoid hemorrhage following injury ([place])    right frontal, following traumatic brain injury   Past Surgical History:  Procedure Laterality Date  . ABDOMINAL HYSTERECTOMY    . APPENDECTOMY    . BREAST ENHANCEMENT SURGERY Right 1997   By [facility]  . CESAREAN SECTION  1966  . MASTECTOMY Right 1996   By [facility]  . PACEMAKER INSERTION  11/27/08   by Greggory Brandy (STM)  . TONSILLECTOMY      Allergies  Allergen Reactions  . Actonel [Risedronate Sodium]   . Aspirin   . Caffeine   . Calcium   . Codeine   . Evista [Raloxifene]   . Fosamax [Alendronate Sodium]   . Morphine   . Penicillins   . Prednisone   . Propoxyphene Hcl   . Propoxyphene N-Acetaminophen   . Sulfa Antibiotics       Medication List       Accurate as of 10/07/16  7:09 PM. Always use your most recent med list.          ASPERCREME W/LIDOCAINE 4 % cream Generic drug:  lidocaine Apply 1 application topically. Apply to right ankle and RLE four times a day as needed for pain   cholecalciferol 1000 units tablet Commonly known as:  VITAMIN D Take 1,000 Units by mouth. Take 2 daily   FISH OIL PO  Take 1 tablet by mouth daily. Pt unsure of dosage   hydrocortisone 1 % ointment Apply 1 application topically 2 (two) times daily as needed for itching.   multivitamin with minerals Tabs tablet Take 1 tablet by mouth daily.   senna 8.6 MG tablet Commonly known as:  SENOKOT Take 1 tablet by mouth. Take one at bedtime   sertraline 25 MG tablet Commonly known as:  ZOLOFT Take 25 mg by mouth daily.       Review of Systems  Constitutional: Negative for activity change, appetite change, chills, diaphoresis, fatigue, fever and unexpected weight change.       Breast cancer in 1996  HENT: Positive for hearing loss (Mild hearing loss), sinus pressure and sore throat. Negative for congestion, ear discharge, ear pain,  postnasal drip, rhinorrhea, tinnitus, trouble swallowing and voice change.   Eyes: Positive for visual disturbance (Corrective lenses). Negative for pain, redness and itching.  Respiratory: Positive for cough (bronchiall rattle). Negative for choking, shortness of breath and wheezing.   Cardiovascular: Negative for chest pain, palpitations and leg swelling.  Gastrointestinal: Negative for abdominal distention, abdominal pain, constipation, diarrhea and nausea.       Flatulence  Endocrine: Negative for cold intolerance, heat intolerance, polydipsia, polyphagia and polyuria.  Genitourinary: Negative for difficulty urinating, dysuria, flank pain, frequency, hematuria, pelvic pain, urgency and vaginal discharge.  Musculoskeletal: Negative for arthralgias, back pain, gait problem, myalgias, neck pain and neck stiffness.  Skin: Negative for color change, pallor and rash.       Chronic lichen planus with itching and sometimes a rash.  Allergic/Immunologic: Negative.   Neurological: Negative for dizziness, tremors, seizures, syncope, weakness, numbness and headaches.       Increasing memory loss and confusion  Hematological: Negative for adenopathy. Bruises/bleeds easily.  Psychiatric/Behavioral: Negative for agitation, behavioral problems, confusion, dysphoric mood, hallucinations, sleep disturbance and suicidal ideas. The patient is nervous/anxious. The patient is not hyperactive.     Immunization History  Administered Date(s) Administered  . Influenza-Unspecified 12/29/2014  . Pneumococcal-Unspecified 09/28/2008  . Tdap 08/03/2012   Pertinent  Health Maintenance Due  Topic Date Due  . DEXA SCAN  05/20/2002  . PNA vac Low Risk Adult (2 of 2 - PCV13) 09/28/2009  . INFLUENZA VACCINE  07/29/2016  . MAMMOGRAM  05/09/2017   Fall Risk  03/25/2016  Falls in the past year? No   Functional Status Survey:    Vitals:   10/07/16 1349  BP: 119/85  Pulse: 72  Resp: 20  Temp: 97.5 F (36.4 C)    Weight: 105 lb (47.6 kg)  Height: 5' 0.5" (1.537 m)   Body mass index is 20.17 kg/m. Physical Exam  Constitutional: She appears well-developed and well-nourished. No distress.  Slightly flushed  HENT:  Right Ear: External ear normal.  Left Ear: External ear normal.  Nose: Nose normal.  Mouth/Throat: Oropharynx is clear and moist. No oropharyngeal exudate.  Eyes: Conjunctivae and EOM are normal. Pupils are equal, round, and reactive to light. No scleral icterus.  Neck: No JVD present. No tracheal deviation present. No thyromegaly present.  Cardiovascular: Normal rate, regular rhythm and normal heart sounds.  Exam reveals no gallop and no friction rub.   No murmur heard. Pacemaker left upper chest wall. Diminished DP and PT bilaterally.  Pulmonary/Chest: Effort normal. No respiratory distress. She has no wheezes. She has rales. She exhibits no tenderness.  Coughing. Bronchial rattle.  Abdominal: She exhibits no distension and no mass. There is no tenderness.  Musculoskeletal:  Normal range of motion. She exhibits no edema or tenderness.  Lymphadenopathy:    She has no cervical adenopathy.  Neurological: She is alert. No cranial nerve deficit. Coordination normal.  Mild memory loss  Skin: No rash noted. She is not diaphoretic. No erythema. No pallor.  Dry rash in localized areas consistent with lichen planus  Psychiatric: Her behavior is normal. Judgment and thought content normal.  Mild anxiety    Labs reviewed:  Recent Labs  06/30/16 09/08/16 09/18/16  NA 142 137 139  K 4.1 4.8 3.5  BUN 16 11 16   CREATININE 0.7 0.8 0.7    Recent Labs  06/30/16 09/08/16  AST 24 24  ALT 23 21  ALKPHOS 39 47    Recent Labs  08/18/16 09/08/16 09/18/16  WBC 4.9 6.7 9.1  HGB 13.8 13.5 14.3  HCT 41 40 42  PLT 186 164 249   Lab Results  Component Value Date   TSH 2.47 08/18/2016   No results found for: HGBA1C No results found for: CHOL, HDL, LDLCALC, LDLDIRECT, TRIG,  CHOLHDL  Significant Diagnostic Results in last 30 days:  No results found.  Assessment/Plan GERD (gastroesophageal reflux disease) This may contribute to her on and off cough and raspy voice, Omeprazole 20mg  daily. Observe the patient.   Acute bronchitis Fully treated, no significant improvement, her cough may be related to GERD  Constipation Stable, continue Senokot I qhs  Atrial fibrillation Heart rate is in control, no rhythm agent.     Memory change AL for care assistance. Repetitive, memory lapses.    OCD (obsessive compulsive disorder) Hx of paranoia, pacing, anorexia, 09/18/16 pharm Sertraline 25mg  09/18/16 wbc 9.1, Hgb 14.3, Plt 249, Na 139, K 3.5, Bun 16, creat 0.69 10/07/16 Psych: dc Sertraline, start Risperdal 0.5mg  bid.       Family/ staff Communication: continue AL for care assistance.   Labs/tests ordered:  none

## 2016-10-07 NOTE — Assessment & Plan Note (Signed)
Stable, continue Senokot I qhs

## 2016-10-07 NOTE — Assessment & Plan Note (Signed)
This may contribute to her on and off cough and raspy voice, Omeprazole 20mg  daily. Observe the patient.

## 2016-10-07 NOTE — Assessment & Plan Note (Signed)
AL for care assistance. Repetitive, memory lapses.

## 2016-10-07 NOTE — Assessment & Plan Note (Signed)
Hx of paranoia, pacing, anorexia, 09/18/16 pharm Sertraline 25mg  09/18/16 wbc 9.1, Hgb 14.3, Plt 249, Na 139, K 3.5, Bun 16, creat 0.69 10/07/16 Psych: dc Sertraline, start Risperdal 0.5mg  bid.

## 2016-10-07 NOTE — Assessment & Plan Note (Signed)
Fully treated, no significant improvement, her cough may be related to GERD

## 2016-10-27 DIAGNOSIS — N390 Urinary tract infection, site not specified: Secondary | ICD-10-CM | POA: Diagnosis not present

## 2016-10-28 ENCOUNTER — Encounter: Payer: Self-pay | Admitting: Nurse Practitioner

## 2016-10-28 ENCOUNTER — Non-Acute Institutional Stay: Payer: Medicare Other | Admitting: Nurse Practitioner

## 2016-10-28 DIAGNOSIS — K5900 Constipation, unspecified: Secondary | ICD-10-CM | POA: Diagnosis not present

## 2016-10-28 DIAGNOSIS — Z950 Presence of cardiac pacemaker: Secondary | ICD-10-CM

## 2016-10-28 DIAGNOSIS — F422 Mixed obsessional thoughts and acts: Secondary | ICD-10-CM | POA: Diagnosis not present

## 2016-10-28 DIAGNOSIS — K219 Gastro-esophageal reflux disease without esophagitis: Secondary | ICD-10-CM

## 2016-10-28 DIAGNOSIS — I480 Paroxysmal atrial fibrillation: Secondary | ICD-10-CM | POA: Diagnosis not present

## 2016-10-28 DIAGNOSIS — L439 Lichen planus, unspecified: Secondary | ICD-10-CM | POA: Diagnosis not present

## 2016-10-28 DIAGNOSIS — E871 Hypo-osmolality and hyponatremia: Secondary | ICD-10-CM | POA: Diagnosis not present

## 2016-10-28 DIAGNOSIS — R413 Other amnesia: Secondary | ICD-10-CM

## 2016-10-28 NOTE — Assessment & Plan Note (Signed)
This may contribute to her on and off cough and raspy voice, improved, continue Omeprazole 20mg  daily. Observe the patient.

## 2016-10-28 NOTE — Assessment & Plan Note (Signed)
Functional

## 2016-10-28 NOTE — Assessment & Plan Note (Signed)
Stable, continue Senokot I qhs

## 2016-10-28 NOTE — Progress Notes (Signed)
Location:  [facility] Room Number: 28 Place of Service:  ALF [phone]) Provider:  Mollyann Halbert, Manxie  NP  [name], MD  Patient Care Team: [name], MD as PCP - General (Internal Medicine) Lakina Mcintire Otho Darner, NP as Nurse Practitioner (Internal Medicine)  Extended Emergency Contact Information Primary Emergency Contact: [facility] Address: [place] [address] [name] of [place] Phone: [phone] Work Phone: [phone] Mobile Phone: [phone] Relation: Son Secondary Emergency Contact: Melida Quitter [place] of [place] Phone: [phone] Relation: Daughter  Code Status:  DNR Goals of care: Advanced Directive information Advanced Directives 10/28/2016  Does patient have an advance directive? Yes  Type of Paramedic of [place];Living will;Out of facility DNR (pink MOST or yellow form)  Does patient want to make changes to advanced directive? No - Patient declined  Copy of advanced directive(s) in chart? Yes  Pre-existing out of facility DNR order (yellow form or pink MOST form) -     Chief Complaint  Patient presents with  . Acute Visit    ? yeast infection     HPI:  Pt is a 81 y.o. female seen today for an acute visit for C/o burning in urogenital area, noted redness. Hx of Lichen planus.     Hx of Afib, heart rate is in control, not on meds, s/p pacemaker, constipation, stable, taking Senokot I daily. OCD, recently started Risperdal, off Zoloft, she had c/o dizziness x1, resolved w/o intervention, no further complaining.  Resides in AL for care assistance.    Past Medical History:  Diagnosis Date  . Breast cancer ([place]) 1996   right side  . Dementia    forgetful  . FH: mastectomy    right side   . Hyponatremia   . Lichen planus   . Osteoporosis   . Pacemaker   . Sinus node dysfunction (HCC)    with syncope; status post implantation of a [employer]. Jude Medical  Zephyr XL model 5826 dual-chamber pacemaker  November 27, 2008.  . Subarachnoid hemorrhage following injury ([facility])    right frontal, following traumatic brain injury   Past Surgical History:  Procedure Laterality Date  . ABDOMINAL HYSTERECTOMY    . APPENDECTOMY    . BREAST ENHANCEMENT SURGERY Right 1997   By [place] [facility]  . CESAREAN SECTION  1966  . MASTECTOMY Right 1996   By T. Davis  . PACEMAKER INSERTION  11/27/08   by Greggory Brandy ([facility])  . TONSILLECTOMY      Allergies  Allergen Reactions  . Actonel [Risedronate Sodium]   . Aspirin   . Caffeine   . Calcium   . Codeine   . Evista [Raloxifene]   . Fosamax [Alendronate Sodium]   . Morphine   . Penicillins   . Prednisone   . Propoxyphene Hcl   . Propoxyphene N-Acetaminophen   . Sulfa Antibiotics       Medication List       Accurate as of 10/28/16  3:20 PM. Always use your most recent med list.          ASPERCREME W/LIDOCAINE 4 % cream Generic drug:  lidocaine Apply 1 application topically. Apply to right ankle and RLE four times a day as needed for pain   cholecalciferol 1000 units tablet Commonly known as:  VITAMIN D Take 1,000 Units by mouth. Take 2 daily   FISH OIL PO Take 1 tablet by mouth  daily. Pt unsure of dosage   hydrocortisone 1 % ointment Apply 1 application topically 2 (two) times daily as needed for itching.   multivitamin with minerals Tabs tablet Take 1 tablet by mouth daily.   senna 8.6 MG tablet Commonly known as:  SENOKOT Take 1 tablet by mouth. Take one at bedtime   sertraline 25 MG tablet Commonly known as:  ZOLOFT Take 25 mg by mouth daily.       Review of Systems  Constitutional: Negative for activity change, appetite change, chills, diaphoresis, fatigue, fever and unexpected weight change.       Breast cancer in 1996  HENT: Positive for hearing loss (Mild hearing loss), sinus pressure and sore throat. Negative for congestion, ear discharge, ear pain, postnasal drip, rhinorrhea,  tinnitus, trouble swallowing and voice change.   Eyes: Positive for visual disturbance (Corrective lenses). Negative for pain, redness and itching.  Respiratory: Positive for cough (bronchiall rattle). Negative for choking, shortness of breath and wheezing.   Cardiovascular: Negative for chest pain, palpitations and leg swelling.  Gastrointestinal: Negative for abdominal distention, abdominal pain, constipation, diarrhea and nausea.       Flatulence  Endocrine: Negative for cold intolerance, heat intolerance, polydipsia, polyphagia and polyuria.  Genitourinary: Negative for difficulty urinating, dysuria, flank pain, frequency, hematuria, pelvic pain, urgency and vaginal discharge.  Musculoskeletal: Negative for arthralgias, back pain, gait problem, myalgias, neck pain and neck stiffness.  Skin: Negative for color change, pallor and rash.       Chronic lichen planus with itching and sometimes a rash. C/o burning in urogenital area, noted redness.     Allergic/Immunologic: Negative.   Neurological: Negative for dizziness, tremors, seizures, syncope, weakness, numbness and headaches.       Increasing memory loss and confusion  Hematological: Negative for adenopathy. Bruises/bleeds easily.  Psychiatric/Behavioral: Negative for agitation, behavioral problems, confusion, dysphoric mood, hallucinations, sleep disturbance and suicidal ideas. The patient is nervous/anxious. The patient is not hyperactive.     Immunization History  Administered Date(s) Administered  . Influenza-Unspecified 12/29/2014, 10/09/2016  . Pneumococcal-Unspecified 09/28/2008  . Tdap 08/03/2012   Pertinent  Health Maintenance Due  Topic Date Due  . DEXA SCAN  05/20/2002  . PNA vac Low Risk Adult (2 of 2 - PCV13) 09/28/2009  . MAMMOGRAM  05/09/2017  . INFLUENZA VACCINE  Completed   Fall Risk  03/25/2016  Falls in the past year? No   Functional Status Survey:    Vitals:   10/28/16 1314  BP: 119/82  Pulse: 79    Resp: 18  Temp: 98.8 F (37.1 C)  SpO2: 96%  Weight: 104 lb 12.8 oz (47.5 kg)  Height: 5' 0.5" (1.537 m)   Body mass index is 20.13 kg/m. Physical Exam  Constitutional: She appears well-developed and well-nourished. No distress.  Slightly flushed  HENT:  Right Ear: External ear normal.  Left Ear: External ear normal.  Nose: Nose normal.  Mouth/Throat: Oropharynx is clear and moist. No oropharyngeal exudate.  Eyes: Conjunctivae and EOM are normal. Pupils are equal, round, and reactive to light. No scleral icterus.  Neck: No JVD present. No tracheal deviation present. No thyromegaly present.  Cardiovascular: Normal rate, regular rhythm and normal heart sounds.  Exam reveals no gallop and no friction rub.   No murmur heard. Pacemaker left upper chest wall. Diminished DP and PT bilaterally.  Pulmonary/Chest: Effort normal. No respiratory distress. She has no wheezes. She has rales. She exhibits no tenderness.  Coughing. Bronchial rattle.  Abdominal: She exhibits  no distension and no mass. There is no tenderness.  Musculoskeletal: Normal range of motion. She exhibits no edema or tenderness.  Lymphadenopathy:    She has no cervical adenopathy.  Neurological: She is alert. No cranial nerve deficit. Coordination normal.  Mild memory loss  Skin: No rash noted. She is not diaphoretic. No erythema. No pallor.  Dry rash in localized areas consistent with lichen planus. C/o burning in urogenital area, noted redness.   Psychiatric: Her behavior is normal. Judgment and thought content normal.  Mild anxiety    Labs reviewed:  Recent Labs  06/30/16 09/08/16 09/18/16  NA 142 137 139  K 4.1 4.8 3.5  BUN 16 11 16   CREATININE 0.7 0.8 0.7    Recent Labs  06/30/16 09/08/16  AST 24 24  ALT 23 21  ALKPHOS 39 47    Recent Labs  08/18/16 09/08/16 09/18/16  WBC 4.9 6.7 9.1  HGB 13.8 13.5 14.3  HCT 41 40 42  PLT 186 164 249   Lab Results  Component Value Date   TSH 2.47 08/18/2016    No results found for: HGBA1C No results found for: CHOL, HDL, LDLCALC, LDLDIRECT, TRIG, CHOLHDL  Significant Diagnostic Results in last 30 days:  No results found.  Assessment/Plan OCD (obsessive compulsive disorder) Hx of paranoia, pacing, anorexia, 09/18/16 pharm Sertraline 25mg  09/18/16 wbc 9.1, Hgb 14.3, Plt 249, Na 139, K 3.5, Bun 16, creat 0.69 10/07/16 Psych: dc Sertraline, start Risperdal 0.5mg  bid.  10/25/16 dizzy spell, resolved w/c intervention Will update CBC CMP, UA 10/28/16 unremarkable.   Pacemaker Functional  Memory change AL for care assistance. Repetitive, memory lapses.     Hyponatremia 09/18/16 wbc 9.1, Hgb 14.3, Plt 249, Na 139, K 3.5, Bun 16, creat 0.69, update CBC and CMP  Lichen planus Hx of it, noted redness in the urogenital area, c/o itching and burning, will apply Nystatin powder bid for now. Observe   GERD (gastroesophageal reflux disease) This may contribute to her on and off cough and raspy voice, improved, continue Omeprazole 20mg  daily. Observe the patient.    Constipation Stable, continue Senokot I qhs  Atrial fibrillation Heart rate is in control, no rhythm agent.       Family/ staff Communication: continue AL for care assistance.   Labs/tests ordered:  CBC and CMP

## 2016-10-28 NOTE — Assessment & Plan Note (Signed)
Hx of paranoia, pacing, anorexia, 09/18/16 pharm Sertraline 25mg  09/18/16 wbc 9.1, Hgb 14.3, Plt 249, Na 139, K 3.5, Bun 16, creat 0.69 10/07/16 Psych: dc Sertraline, start Risperdal 0.5mg  bid.  10/25/16 dizzy spell, resolved w/c intervention Will update CBC CMP, UA 10/28/16 unremarkable.

## 2016-10-28 NOTE — Assessment & Plan Note (Signed)
Hx of it, noted redness in the urogenital area, c/o itching and burning, will apply Nystatin powder bid for now. Observe

## 2016-10-28 NOTE — Assessment & Plan Note (Signed)
AL for care assistance. Repetitive, memory lapses.

## 2016-10-28 NOTE — Assessment & Plan Note (Signed)
Heart rate is in control, no rhythm agent.

## 2016-10-28 NOTE — Assessment & Plan Note (Addendum)
09/18/16 wbc 9.1, Hgb 14.3, Plt 249, Na 139, K 3.5, Bun 16, creat 0.69, update CBC and CMP

## 2016-10-30 DIAGNOSIS — I10 Essential (primary) hypertension: Secondary | ICD-10-CM | POA: Diagnosis not present

## 2016-10-30 DIAGNOSIS — D649 Anemia, unspecified: Secondary | ICD-10-CM | POA: Diagnosis not present

## 2016-10-30 LAB — HEPATIC FUNCTION PANEL
ALK PHOS: 41 U/L (ref 25–125)
ALT: 26 U/L (ref 7–35)
AST: 30 U/L (ref 13–35)
Bilirubin, Total: 0.6 mg/dL

## 2016-10-30 LAB — BASIC METABOLIC PANEL
BUN: 14 mg/dL (ref 4–21)
CREATININE: 0.7 mg/dL (ref 0.5–1.1)
Glucose: 83 mg/dL
Potassium: 4.2 mmol/L (ref 3.4–5.3)
Sodium: 138 mmol/L (ref 137–147)

## 2016-10-30 LAB — CBC AND DIFFERENTIAL
HEMATOCRIT: 42 % (ref 36–46)
HEMOGLOBIN: 14.2 g/dL (ref 12.0–16.0)
PLATELETS: 153 10*3/uL (ref 150–399)
WBC: 4.9 10*3/mL

## 2016-11-04 ENCOUNTER — Non-Acute Institutional Stay: Payer: Medicare Other | Admitting: Internal Medicine

## 2016-11-04 ENCOUNTER — Encounter: Payer: Self-pay | Admitting: Internal Medicine

## 2016-11-04 VITALS — BP 110/72 | HR 67 | Temp 97.9°F | Wt 110.0 lb

## 2016-11-04 DIAGNOSIS — R413 Other amnesia: Secondary | ICD-10-CM

## 2016-11-04 DIAGNOSIS — F422 Mixed obsessional thoughts and acts: Secondary | ICD-10-CM | POA: Diagnosis not present

## 2016-11-04 DIAGNOSIS — I480 Paroxysmal atrial fibrillation: Secondary | ICD-10-CM | POA: Diagnosis not present

## 2016-11-04 MED ORDER — RISPERIDONE 0.25 MG PO TABS: ORAL_TABLET | ORAL | 5 refills | Status: DC

## 2016-11-04 NOTE — Progress Notes (Signed)
[place] Room Number: AL 28  Place of Service: Clinic (12)     Allergies  Allergen Reactions  . Actonel [Risedronate Sodium]   . Aspirin   . Caffeine   . Calcium   . Codeine   . Evista [Raloxifene]   . Fosamax [Alendronate Sodium]   . Morphine   . Penicillins   . Prednisone   . Propoxyphene Hcl   . Propoxyphene N-Acetaminophen   . Sulfa Antibiotics     Chief Complaint  Patient presents with  . Medical Management of Chronic Issues    4 month medication management memory, A-Fib, osteoporosis, hyponatremia  . Altered Mental Status    per nurse fax noted to have increase confusion, ua was negative, also noted with disorientation about location. Asked if they can reduce doseage of reisperdal 0.57m to 0.285mbid.     HPI:  Staff reports increase in confusion. MMSE down to 18/30.  She was started on risperidone about a month ago. Does not show evidence of new gait disturbance or daytime drowsiness. Has a long history of episodes of paranoia, fearfulness, and anxiety. None of these symptoms seem present at this time, although her family had expressed concerns that they were seeing these problems prior to initiation of treatment with risperidone.  AF controlled.  Medications: Patient's Medications  New Prescriptions   No medications on file  Previous Medications   CHOLECALCIFEROL (VITAMIN D) 1000 UNITS TABLET    Take 1,000 Units by mouth. Take 2 daily   HYDROCORTISONE 1 % OINTMENT    Apply 1 application topically 2 (two) times daily as needed for itching.   LIDOCAINE (ASPERCREME W/LIDOCAINE) 4 % CREAM    Apply 1 application topically. Apply to right ankle and RLE four times a day as needed for pain   MULTIPLE VITAMIN (MULITIVITAMIN WITH MINERALS) TABS    Take 1 tablet by mouth daily.   NYSTATIN (MYCOSTATIN/NYSTOP) POWDER    Apply topically. Apply power to urogenital area twice daily   OMEGA-3 FATTY ACIDS (FISH OIL PO)    Take 1 tablet by mouth daily. Pt  unsure of dosage   OMEPRAZOLE (PRILOSEC) 20 MG CAPSULE    Take 20 mg by mouth daily.   RISPERIDONE (RISPERDAL) 0.5 MG TABLET    Take 0.5 mg by mouth. Take one tablet twice daily   SENNA (SENOKOT) 8.6 MG TABLET    Take 1 tablet by mouth. Take one at bedtime  Modified Medications   No medications on file  Discontinued Medications   SERTRALINE (ZOLOFT) 25 MG TABLET    Take 25 mg by mouth daily.     Review of Systems  Constitutional: Negative for activity change, appetite change, chills, diaphoresis, fatigue, fever and unexpected weight change.       Breast cancer in 1996  HENT: Positive for hearing loss (Mild hearing loss), sinus pressure and sore throat. Negative for congestion, ear discharge, ear pain, postnasal drip, rhinorrhea, tinnitus, trouble swallowing and voice change.   Eyes: Positive for visual disturbance (Corrective lenses). Negative for pain, redness and itching.  Respiratory: Positive for cough (bronchiall rattle). Negative for choking, shortness of breath and wheezing.   Cardiovascular: Negative for chest pain, palpitations and leg swelling.  Gastrointestinal: Negative for abdominal distention, abdominal pain, constipation, diarrhea and nausea.       Flatulence  Endocrine: Negative for cold intolerance, heat intolerance, polydipsia, polyphagia and polyuria.  Genitourinary: Negative for difficulty urinating, dysuria, flank pain, frequency, hematuria, pelvic pain, urgency  and vaginal discharge.  Musculoskeletal: Negative for arthralgias, back pain, gait problem, myalgias, neck pain and neck stiffness.  Skin: Negative for color change, pallor and rash.       Chronic lichen planus with itching and sometimes a rash. C/o burning in urogenital area, noted redness.   Allergic/Immunologic: Negative.   Neurological: Negative for dizziness, tremors, seizures, syncope, weakness, numbness and headaches.       Increasing memory loss and confusion  Hematological: Negative for adenopathy.  Bruises/bleeds easily.  Psychiatric/Behavioral: Negative for agitation, behavioral problems, confusion, dysphoric mood, hallucinations, sleep disturbance and suicidal ideas. The patient is nervous/anxious. The patient is not hyperactive.     Vitals:   11/04/16 1039  BP: 110/72  Pulse: 67  Temp: 97.9 F (36.6 C)  TempSrc: Oral  SpO2: 99%  Weight: 110 lb (49.9 kg)   Wt Readings from Last 3 Encounters:  11/04/16 110 lb (49.9 kg)  10/28/16 104 lb 12.8 oz (47.5 kg)  10/07/16 105 lb (47.6 kg)    Body mass index is 21.13 kg/m.  Physical Exam  Constitutional: She appears well-developed and well-nourished. No distress.  Slightly flushed  HENT:  Right Ear: External ear normal.  Left Ear: External ear normal.  Nose: Nose normal.  Mouth/Throat: Oropharynx is clear and moist. No oropharyngeal exudate.  Eyes: Conjunctivae and EOM are normal. Pupils are equal, round, and reactive to light. No scleral icterus.  Neck: No JVD present. No tracheal deviation present. No thyromegaly present.  Cardiovascular: Normal rate, regular rhythm and normal heart sounds.  Exam reveals no gallop and no friction rub.   No murmur heard. Pacemaker left upper chest wall. Diminished DP and PT bilaterally.  Pulmonary/Chest: Effort normal. No respiratory distress. She has no wheezes. She has rales. She exhibits no tenderness.  Coughing. Bronchial rattle.  Abdominal: She exhibits no distension and no mass. There is no tenderness.  Musculoskeletal: Normal range of motion. She exhibits no edema or tenderness.  Lymphadenopathy:    She has no cervical adenopathy.  Neurological: She is alert. No cranial nerve deficit. Coordination normal.  Moderate memory loss. MMSE 18/30. Failed clock drawing.  Skin: No rash noted. She is not diaphoretic. No erythema. No pallor.  Dry rash in localized areas consistent with lichen planus. C/o burning in urogenital area, noted redness.   Psychiatric: Her behavior is normal.  Judgment and thought content normal.  Mild anxiety     Labs reviewed: Lab Summary Latest Ref Rng & Units 09/18/2016 09/08/2016 08/18/2016 06/30/2016  Hemoglobin 12.0 - 16.0 g/dL 14.3 13.5 13.8 (None)  Hematocrit 36 - 46 % 42 40 41 (None)  White count 10:3/mL 9.1 6.7 4.9 (None)  Platelet count 150 - 399 K/L 249 164 186 (None)  Sodium 137 - 147 mmol/L 139 137 (None) 142  Potassium 3.4 - 5.3 mmol/L 3.5 4.8 (None) 4.1  Calcium - (None) (None) (None) (None)  Phosphorus - (None) (None) (None) (None)  Creatinine 0.5 - 1.1 mg/dL 0.7 0.8 (None) 0.7  AST 13 - 35 U/L (None) 24 (None) 24  Alk Phos 25 - 125 U/L (None) 47 (None) 39  Bilirubin - (None) (None) (None) (None)  Glucose mg/dL 92 129 (None) 86  Cholesterol - (None) (None) (None) (None)  HDL cholesterol - (None) (None) (None) (None)  Triglycerides - (None) (None) (None) (None)  LDL Direct - (None) (None) (None) (None)  LDL Calc - (None) (None) (None) (None)  Total protein - (None) (None) (None) (None)  Albumin - (None) (None) (None) (None)  Some  recent data might be hidden   Lab Results  Component Value Date   TSH 2.47 08/18/2016   Lab Results  Component Value Date   BUN 16 09/18/2016   BUN 11 09/08/2016   BUN 16 06/30/2016   Lab Results  Component Value Date   CREATININE 0.7 09/18/2016   CREATININE 0.8 09/08/2016   CREATININE 0.7 06/30/2016   No results found for: HGBA1C     Assessment/Plan  1. Mixed obsessional thoughts and acts Reduce dose of rispeeridone - risperiDONE (RISPERDAL) 0.25 MG tablet; One at bedtime to help nerves  Dispense: 30 tablet; Refill: 5  2. Paroxysmal atrial fibrillation (HCC) Currently in AF  3. Memory change I believe the reported confusion is related to Progressive loss of memory.

## 2016-11-07 ENCOUNTER — Encounter: Payer: Self-pay | Admitting: Nurse Practitioner

## 2016-11-07 NOTE — Progress Notes (Signed)
This encounter was created in error - please disregard.

## 2016-12-09 ENCOUNTER — Encounter: Payer: Self-pay | Admitting: Nurse Practitioner

## 2016-12-09 ENCOUNTER — Non-Acute Institutional Stay: Payer: Medicare Other | Admitting: Nurse Practitioner

## 2016-12-09 DIAGNOSIS — I480 Paroxysmal atrial fibrillation: Secondary | ICD-10-CM

## 2016-12-09 DIAGNOSIS — K219 Gastro-esophageal reflux disease without esophagitis: Secondary | ICD-10-CM | POA: Diagnosis not present

## 2016-12-09 DIAGNOSIS — F422 Mixed obsessional thoughts and acts: Secondary | ICD-10-CM | POA: Diagnosis not present

## 2016-12-09 DIAGNOSIS — R413 Other amnesia: Secondary | ICD-10-CM | POA: Diagnosis not present

## 2016-12-09 DIAGNOSIS — K5900 Constipation, unspecified: Secondary | ICD-10-CM | POA: Diagnosis not present

## 2016-12-09 DIAGNOSIS — E871 Hypo-osmolality and hyponatremia: Secondary | ICD-10-CM

## 2016-12-09 NOTE — Assessment & Plan Note (Addendum)
10/07/16 Psych: dc Sertraline, start Risperdal 0.5mg  bid. Hx of paranoia, pacing, anorexia better

## 2016-12-09 NOTE — Assessment & Plan Note (Signed)
Heart rate is in control, not on rhythm agent.

## 2016-12-09 NOTE — Assessment & Plan Note (Signed)
AL for care assistance. Repetitive, memory lapses.

## 2016-12-09 NOTE — Progress Notes (Signed)
Location:  [facility] Room Number: 28 Place of Service:  ALF [phone]) Provider:  Ellyanna Holton, Manxie  NP  [name], MD  Patient Care Team: [name], MD as PCP - General (Internal Medicine) Fayette Gasner Otho Darner, NP as Nurse Practitioner (Internal Medicine)  Extended Emergency Contact Information Primary Emergency Contact: [facility] Address: [place] [address] [name] of [place] Phone: [phone] Work Phone: [phone] Mobile Phone: [phone] Relation: Son Secondary Emergency Contact: Melida Quitter [place] of [place] Phone: [phone] Relation: Daughter  Code Status:  DNR Goals of care: Advanced Directive information Advanced Directives 12/09/2016  Does Patient Have a Medical Advance Directive? Yes  Type of Paramedic of [place];Out of facility DNR (pink MOST or yellow form)  Does patient want to make changes to medical advance directive? No - Patient declined  Copy of [place] in Chart? Yes  Pre-existing out of facility DNR order (yellow form or pink MOST form) Yellow form placed in chart (order not valid for inpatient use)     Chief Complaint  Patient presents with  . Medical Management of Chronic Issues    HPI:  Pt is a 81 y.o. female seen today for medical management of chronic diseases.     Hx of Afib, heart rate is in control, not on meds, s/p pacemaker, constipation, stable, taking Senokot I daily. OCD, recently started Risperdal, off Zoloft, she gets lightheaded easily.  Resides in AL for care assistance.  Past Medical History:  Diagnosis Date  . Breast cancer ([facility]) 1996   right side  . Dementia    forgetful  . FH: mastectomy    right side   . Hyponatremia   . Lichen planus   . Osteoporosis   . Pacemaker   . Sinus node dysfunction (HCC)    with syncope; status post implantation of a [employer] Zephyr XL model 5826  dual-chamber pacemaker  November 27, 2008.  . Subarachnoid hemorrhage following injury ([facility])    right frontal, following traumatic brain injury   Past Surgical History:  Procedure Laterality Date  . ABDOMINAL HYSTERECTOMY    . APPENDECTOMY    . BREAST ENHANCEMENT SURGERY Right 1997   By [facility]  . CESAREAN SECTION  1966  . MASTECTOMY Right 1996   By T. Davis  . PACEMAKER INSERTION  11/27/08   by Greggory Brandy ([facility])  . TONSILLECTOMY      Allergies  Allergen Reactions  . Actonel [Risedronate Sodium]   . Aspirin   . Caffeine   . Calcium   . Codeine   . Evista [Raloxifene]   . Fosamax [Alendronate Sodium]   . Morphine   . Penicillins   . Prednisone   . Propoxyphene Hcl   . Propoxyphene N-Acetaminophen   . Sulfa Antibiotics       Medication List       Accurate as of 12/09/16  4:01 PM. Always use your most recent med list.          ASPERCREME W/LIDOCAINE 4 % cream Generic drug:  lidocaine Apply 1 application topically. Apply to right ankle and RLE four times a day as needed for pain   cholecalciferol 1000 units tablet Commonly known as:  VITAMIN D Take 2,000 Units by mouth. Take 2 daily   FISH OIL PO Take 1 tablet by mouth daily. Pt unsure of dosage   hydrocortisone 1 %  ointment Apply 1 application topically 2 (two) times daily as needed for itching.   multivitamin with minerals Tabs tablet Take 1 tablet by mouth daily.   nystatin powder Commonly known as:  MYCOSTATIN/NYSTOP Apply topically. Apply power to urogenital area twice daily   omeprazole 20 MG capsule Commonly known as:  PRILOSEC Take 20 mg by mouth daily.   risperiDONE 0.25 MG tablet Commonly known as:  RISPERDAL One at bedtime to help nerves   senna 8.6 MG tablet Commonly known as:  SENOKOT Take 1 tablet by mouth. Take one at bedtime       Review of Systems  Constitutional: Negative for activity change, appetite change, chills, diaphoresis, fatigue, fever and unexpected weight change.        Breast cancer in 1996  HENT: Positive for hearing loss (Mild hearing loss), sinus pressure and sore throat. Negative for congestion, ear discharge, ear pain, postnasal drip, rhinorrhea, tinnitus, trouble swallowing and voice change.   Eyes: Positive for visual disturbance (Corrective lenses). Negative for pain, redness and itching.  Respiratory: Positive for cough (bronchiall rattle). Negative for choking, shortness of breath and wheezing.   Cardiovascular: Negative for chest pain, palpitations and leg swelling.  Gastrointestinal: Negative for abdominal distention, abdominal pain, constipation, diarrhea and nausea.       Flatulence  Endocrine: Negative for cold intolerance, heat intolerance, polydipsia, polyphagia and polyuria.  Genitourinary: Negative for difficulty urinating, dysuria, flank pain, frequency, hematuria, pelvic pain, urgency and vaginal discharge.  Musculoskeletal: Negative for arthralgias, back pain, gait problem, myalgias, neck pain and neck stiffness.  Skin: Negative for color change, pallor and rash.       Chronic lichen planus with itching and sometimes a rash. C/o burning in urogenital area, noted redness.   Allergic/Immunologic: Negative.   Neurological: Negative for dizziness, tremors, seizures, syncope, weakness, numbness and headaches.       Increasing memory loss and confusion  Hematological: Negative for adenopathy. Bruises/bleeds easily.  Psychiatric/Behavioral: Negative for agitation, behavioral problems, confusion, dysphoric mood, hallucinations, sleep disturbance and suicidal ideas. The patient is nervous/anxious. The patient is not hyperactive.     Immunization History  Administered Date(s) Administered  . Influenza-Unspecified 12/29/2014, 10/09/2016  . Pneumococcal-Unspecified 09/28/2008  . Tdap 08/03/2012   Pertinent  Health Maintenance Due  Topic Date Due  . DEXA SCAN  05/20/2002  . PNA vac Low Risk Adult (2 of 2 - PCV13) 09/28/2009  . MAMMOGRAM   05/09/2017  . INFLUENZA VACCINE  Completed   Fall Risk  03/25/2016  Falls in the past year? No   Functional Status Survey:    Vitals:   12/09/16 1414  BP: 112/74  Pulse: 74  Resp: 16  Temp: 98.7 F (37.1 C)  SpO2: 97%  Weight: 108 lb 9.6 oz (49.3 kg)  Height: 5\' 5"  (1.651 m)   Body mass index is 18.07 kg/m. Physical Exam  Constitutional: She appears well-developed and well-nourished. No distress.  Slightly flushed  HENT:  Right Ear: External ear normal.  Left Ear: External ear normal.  Nose: Nose normal.  Mouth/Throat: Oropharynx is clear and moist. No oropharyngeal exudate.  Eyes: Conjunctivae and EOM are normal. Pupils are equal, round, and reactive to light. No scleral icterus.  Neck: No JVD present. No tracheal deviation present. No thyromegaly present.  Cardiovascular: Normal rate, regular rhythm and normal heart sounds.  Exam reveals no gallop and no friction rub.   No murmur heard. Pacemaker left upper chest wall. Diminished DP and PT bilaterally.  Pulmonary/Chest: Effort normal.  No respiratory distress. She has no wheezes. She has rales. She exhibits no tenderness.  Coughing. Bronchial rattle.  Abdominal: She exhibits no distension and no mass. There is no tenderness.  Musculoskeletal: Normal range of motion. She exhibits no edema or tenderness.  Lymphadenopathy:    She has no cervical adenopathy.  Neurological: She is alert. No cranial nerve deficit. Coordination normal.  Moderate memory loss. MMSE 18/30. Failed clock drawing.  Skin: No rash noted. She is not diaphoretic. No erythema. No pallor.  Dry rash in localized areas consistent with lichen planus. C/o burning in urogenital area, noted redness.   Psychiatric: Her behavior is normal. Judgment and thought content normal.  Mild anxiety    Labs reviewed:  Recent Labs  06/30/16 09/08/16 09/18/16  NA 142 137 139  K 4.1 4.8 3.5  BUN 16 11 16   CREATININE 0.7 0.8 0.7    Recent Labs  06/30/16 09/08/16   AST 24 24  ALT 23 21  ALKPHOS 39 47    Recent Labs  08/18/16 09/08/16 09/18/16  WBC 4.9 6.7 9.1  HGB 13.8 13.5 14.3  HCT 41 40 42  PLT 186 164 249   Lab Results  Component Value Date   TSH 2.47 08/18/2016   No results found for: HGBA1C No results found for: CHOL, HDL, LDLCALC, LDLDIRECT, TRIG, CHOLHDL  Significant Diagnostic Results in last 30 days:  No results found.  Assessment/Plan Atrial fibrillation Heart rate is in control, not on rhythm agent.     Constipation Stable, continue Senokot I qhs   GERD (gastroesophageal reflux disease) This may contribute to her on and off cough and raspy voice, improved, continue Omeprazole 20mg  daily. Observe the patient.     Hyponatremia 10/30/16 Na 138  Memory change AL for care assistance. Repetitive, memory lapses.   OCD (obsessive compulsive disorder) 10/07/16 Psych: dc Sertraline, start Risperdal 0.5mg  bid. Hx of paranoia, pacing, anorexia better   Family/ staff Communication: AL  Labs/tests ordered:  none

## 2016-12-09 NOTE — Assessment & Plan Note (Signed)
10/30/16 Na 138

## 2016-12-09 NOTE — Assessment & Plan Note (Signed)
Stable, continue Senokot I qhs

## 2016-12-09 NOTE — Assessment & Plan Note (Signed)
This may contribute to her on and off cough and raspy voice, improved, continue Omeprazole 20mg  daily. Observe the patient.

## 2016-12-11 DIAGNOSIS — E119 Type 2 diabetes mellitus without complications: Secondary | ICD-10-CM | POA: Diagnosis not present

## 2016-12-11 DIAGNOSIS — Z13220 Encounter for screening for lipoid disorders: Secondary | ICD-10-CM | POA: Diagnosis not present

## 2016-12-11 DIAGNOSIS — E612 Magnesium deficiency: Secondary | ICD-10-CM | POA: Diagnosis not present

## 2016-12-11 LAB — LIPID PANEL
CHOLESTEROL: 207 mg/dL — AB (ref 0–200)
HDL: 56 mg/dL (ref 35–70)
LDL Cholesterol: 138 mg/dL
TRIGLYCERIDES: 64 mg/dL (ref 40–160)

## 2016-12-11 LAB — HEMOGLOBIN A1C: HEMOGLOBIN A1C: 5.2

## 2016-12-23 ENCOUNTER — Non-Acute Institutional Stay: Payer: Medicare Other | Admitting: Internal Medicine

## 2016-12-23 ENCOUNTER — Encounter: Payer: Self-pay | Admitting: Internal Medicine

## 2016-12-23 VITALS — BP 110/82 | HR 72 | Temp 97.5°F | Ht 65.0 in | Wt 114.0 lb

## 2016-12-23 DIAGNOSIS — E871 Hypo-osmolality and hyponatremia: Secondary | ICD-10-CM

## 2016-12-23 DIAGNOSIS — R413 Other amnesia: Secondary | ICD-10-CM | POA: Diagnosis not present

## 2016-12-23 DIAGNOSIS — F422 Mixed obsessional thoughts and acts: Secondary | ICD-10-CM | POA: Diagnosis not present

## 2016-12-23 DIAGNOSIS — I480 Paroxysmal atrial fibrillation: Secondary | ICD-10-CM

## 2016-12-23 MED ORDER — RISPERIDONE 0.25 MG PO TABS: ORAL_TABLET | ORAL | 5 refills | Status: DC

## 2016-12-23 NOTE — Progress Notes (Signed)
[facility] Room Number: AJ28  Place of Service: Clinic (12)     Allergies  Allergen Reactions  . Actonel [Risedronate Sodium]   . Aspirin   . Caffeine   . Calcium   . Codeine   . Evista [Raloxifene]   . Fosamax [Alendronate Sodium]   . Morphine   . Penicillins   . Prednisone   . Propoxyphene Hcl   . Propoxyphene N-Acetaminophen   . Sulfa Antibiotics     Chief Complaint  Patient presents with  . Medical Management of Chronic Issues    5 week follow-up mix obessional thoughts, memory    HPI:  Last seen 12/09/16 by M. Mast. Started on risperidal in Oct 2017 for OCD, pacing, paranoia, anorexia. She was found walking on [address] near [place] on 12/21/16. She says it helps her to walk when she is feeling nervous. Most days are good. She likes [facility] and plans to stay. She believes the staff is treating her well. She claims she is sleeping OK. Staff believes we should try to increase risperidone again. It was reduced in the last few months due to a sleepy state.  Medications: Patient's Medications  New Prescriptions   No medications on file  Previous Medications   CHOLECALCIFEROL (VITAMIN D) 1000 UNITS TABLET    Take 2,000 Units by mouth. Take 2 daily   HYDROCORTISONE 1 % OINTMENT    Apply 1 application topically 2 (two) times daily as needed for itching.   LIDOCAINE (ASPERCREME W/LIDOCAINE) 4 % CREAM    Apply 1 application topically. Apply to right ankle and RLE four times a day as needed for pain   MULTIPLE VITAMIN (MULITIVITAMIN WITH MINERALS) TABS    Take 1 tablet by mouth daily.   NYSTATIN (MYCOSTATIN/NYSTOP) POWDER    Apply topically. Apply power to urogenital area twice daily   OMEGA-3 FATTY ACIDS (FISH OIL PO)    Take 1 tablet by mouth daily. Pt unsure of dosage   OMEPRAZOLE (PRILOSEC) 20 MG CAPSULE    Take 20 mg by mouth daily.   RISPERIDONE (RISPERDAL) 0.25 MG TABLET    One at bedtime to help nerves   SENNA (SENOKOT) 8.6 MG TABLET    Take 1  tablet by mouth. Take one at bedtime  Modified Medications   No medications on file  Discontinued Medications   No medications on file     Review of Systems  Constitutional: Negative for activity change, appetite change, chills, diaphoresis, fatigue, fever and unexpected weight change.       Breast cancer in 1996  HENT: Positive for hearing loss (Mild hearing loss), sinus pressure and sore throat. Negative for congestion, ear discharge, ear pain, postnasal drip, rhinorrhea, tinnitus, trouble swallowing and voice change.   Eyes: Positive for visual disturbance (Corrective lenses). Negative for pain, redness and itching.  Respiratory: Positive for cough (bronchiall rattle). Negative for choking, shortness of breath and wheezing.   Cardiovascular: Negative for chest pain, palpitations and leg swelling.  Gastrointestinal: Negative for abdominal distention, abdominal pain, constipation, diarrhea and nausea.       Flatulence  Endocrine: Negative for cold intolerance, heat intolerance, polydipsia, polyphagia and polyuria.  Genitourinary: Negative for difficulty urinating, dysuria, flank pain, frequency, hematuria, pelvic pain, urgency and vaginal discharge.  Musculoskeletal: Negative for arthralgias, back pain, gait problem, myalgias, neck pain and neck stiffness.  Skin: Negative for color change, pallor and rash.       Chronic lichen planus with itching  and sometimes a rash. C/o burning in urogenital area, noted redness.   Allergic/Immunologic: Negative.   Neurological: Negative for dizziness, tremors, seizures, syncope, weakness, numbness and headaches.       Increasing memory loss and confusion  Hematological: Negative for adenopathy. Bruises/bleeds easily.  Psychiatric/Behavioral: Negative for agitation, behavioral problems, confusion, dysphoric mood, hallucinations, sleep disturbance and suicidal ideas. The patient is nervous/anxious. The patient is not hyperactive.     Vitals:   12/23/16  0934  BP: 110/82  Pulse: 72  Temp: 97.5 F (36.4 C)  TempSrc: Oral  SpO2: 98%  Weight: 114 lb (51.7 kg)  Height: 5' 5"  (1.651 m)   Wt Readings from Last 3 Encounters:  12/23/16 114 lb (51.7 kg)  12/09/16 108 lb 9.6 oz (49.3 kg)  11/04/16 110 lb (49.9 kg)    Body mass index is 18.97 kg/m.  Physical Exam  Constitutional: She appears well-developed and well-nourished. No distress.  Slightly flushed  HENT:  Right Ear: External ear normal.  Left Ear: External ear normal.  Nose: Nose normal.  Mouth/Throat: Oropharynx is clear and moist. No oropharyngeal exudate.  Eyes: Conjunctivae and EOM are normal. Pupils are equal, round, and reactive to light. No scleral icterus.  Neck: No JVD present. No tracheal deviation present. No thyromegaly present.  Cardiovascular: Normal rate, regular rhythm and normal heart sounds.  Exam reveals no gallop and no friction rub.   No murmur heard. Pacemaker left upper chest wall. Diminished DP and PT bilaterally.  Pulmonary/Chest: Effort normal. No respiratory distress. She has no wheezes. She has rales. She exhibits no tenderness.  Coughing. Bronchial rattle.  Abdominal: She exhibits no distension and no mass. There is no tenderness.  Musculoskeletal: Normal range of motion. She exhibits no edema or tenderness.  Lymphadenopathy:    She has no cervical adenopathy.  Neurological: She is alert. No cranial nerve deficit. Coordination normal.  Moderate memory loss. MMSE 18/30. Failed clock drawing.  Skin: No rash noted. She is not diaphoretic. No erythema. No pallor.  Dry rash in localized areas consistent with lichen planus. C/o burning in urogenital area, noted redness.   Psychiatric: Her behavior is normal. Judgment and thought content normal.  Mild anxiety     Labs reviewed: Lab Summary Latest Ref Rng & Units 12/11/2016 10/30/2016 09/18/2016 09/08/2016  Hemoglobin 12.0 - 16.0 g/dL (None) 14.2 14.3 13.5  Hematocrit 36 - 46 % (None) 42 42 40    White count 10:3/mL (None) 4.9 9.1 6.7  Platelet count 150 - 399 K/L (None) 153 249 164  Sodium 137 - 147 mmol/L (None) 138 139 137  Potassium 3.4 - 5.3 mmol/L (None) 4.2 3.5 4.8  Calcium - (None) (None) (None) (None)  Phosphorus - (None) (None) (None) (None)  Creatinine 0.5 - 1.1 mg/dL (None) 0.7 0.7 0.8  AST 13 - 35 U/L (None) 30 (None) 24  Alk Phos 25 - 125 U/L (None) 41 (None) 47  Bilirubin - (None) (None) (None) (None)  Glucose mg/dL (None) 83 92 129  Cholesterol 0 - 200 mg/dL 207(A) (None) (None) (None)  HDL cholesterol 35 - 70 mg/dL 56 (None) (None) (None)  Triglycerides 40 - 160 mg/dL 64 (None) (None) (None)  LDL Direct - (None) (None) (None) (None)  LDL Calc mg/dL 138 (None) (None) (None)  Total protein - (None) (None) (None) (None)  Albumin - (None) (None) (None) (None)  Some recent data might be hidden   Lab Results  Component Value Date   TSH 2.47 08/18/2016   Lab Results  Component Value Date   BUN 14 10/30/2016   BUN 16 09/18/2016   BUN 11 09/08/2016   Lab Results  Component Value Date   CREATININE 0.7 10/30/2016   CREATININE 0.7 09/18/2016   CREATININE 0.8 09/08/2016   Lab Results  Component Value Date   HGBA1C 5.2 12/11/2016       Assessment/Plan  1. Mixed obsessional thoughts and acts - risperiDONE (RISPERDAL) 0.25 MG tablet; One at lunch and one at bedtime to help nerves  Dispense: 60 tablet; Refill: 5 -If she becomes groggy again, would try a diffierent medication, such as Seroquel or Abilify.  2. Paroxysmal atrial fibrillation (HCC) Rate controlled  3. Hyponatremia resolved  4. Memory change stable

## 2016-12-24 ENCOUNTER — Telehealth: Payer: Self-pay | Admitting: *Deleted

## 2016-12-24 DIAGNOSIS — F422 Mixed obsessional thoughts and acts: Secondary | ICD-10-CM

## 2016-12-24 NOTE — Telephone Encounter (Signed)
Medication was recently increased because of her increased psychiatric symptoms at the lower dose.. I will stop at request of the daughter in law, but I am concerned that she may get worse rather than get better. Please call [facility] to stop the risperidone at the request of the daughter in law.

## 2016-12-24 NOTE — Telephone Encounter (Signed)
[name], daughter in [place] called and stated that the Family would like for you to take patient off of the Risperidone. Family is concerned that her behavior [name] be coming from it because she is sensitive to medications. They would like for her to come off and get out of her system and then have her reevaluated. Please Advise.

## 2016-12-24 NOTE — Telephone Encounter (Signed)
Peter Congo, daughter in law notified and agreed. I called [facility] and spoke with Sherlynn Stalls and faxed the order to Discontinue Risperidone to Fax#: [phone]. Medication list updated.

## 2016-12-26 DIAGNOSIS — I69020 Aphasia following nontraumatic subarachnoid hemorrhage: Secondary | ICD-10-CM | POA: Diagnosis not present

## 2016-12-26 DIAGNOSIS — E785 Hyperlipidemia, unspecified: Secondary | ICD-10-CM | POA: Diagnosis not present

## 2016-12-26 DIAGNOSIS — I639 Cerebral infarction, unspecified: Secondary | ICD-10-CM | POA: Diagnosis not present

## 2016-12-26 DIAGNOSIS — M79671 Pain in right foot: Secondary | ICD-10-CM | POA: Diagnosis not present

## 2016-12-26 DIAGNOSIS — R262 Difficulty in walking, not elsewhere classified: Secondary | ICD-10-CM | POA: Diagnosis not present

## 2016-12-26 DIAGNOSIS — M79604 Pain in right leg: Secondary | ICD-10-CM | POA: Diagnosis not present

## 2016-12-26 DIAGNOSIS — R4182 Altered mental status, unspecified: Secondary | ICD-10-CM | POA: Diagnosis not present

## 2016-12-26 DIAGNOSIS — H8113 Benign paroxysmal vertigo, bilateral: Secondary | ICD-10-CM | POA: Diagnosis not present

## 2016-12-26 DIAGNOSIS — Z950 Presence of cardiac pacemaker: Secondary | ICD-10-CM | POA: Diagnosis not present

## 2017-01-13 ENCOUNTER — Encounter: Payer: Self-pay | Admitting: Nurse Practitioner

## 2017-01-13 ENCOUNTER — Non-Acute Institutional Stay: Payer: Medicare Other | Admitting: Nurse Practitioner

## 2017-01-13 DIAGNOSIS — R413 Other amnesia: Secondary | ICD-10-CM

## 2017-01-13 DIAGNOSIS — K219 Gastro-esophageal reflux disease without esophagitis: Secondary | ICD-10-CM

## 2017-01-13 DIAGNOSIS — K5900 Constipation, unspecified: Secondary | ICD-10-CM

## 2017-01-13 DIAGNOSIS — F422 Mixed obsessional thoughts and acts: Secondary | ICD-10-CM

## 2017-01-13 NOTE — Assessment & Plan Note (Signed)
This may contribute to her on and off cough and raspy voice, improved, continue Omeprazole 20mg  daily. Observe the patient.

## 2017-01-13 NOTE — Assessment & Plan Note (Signed)
10/07/16 Psych: dc Sertraline. Hx of paranoia, pacing, anorexia 12/24/16 POA requested to dc Risperdal 01/13/17 POA request: Alprazolam 0.125mg  bid

## 2017-01-13 NOTE — Progress Notes (Signed)
Location:  [facility] Room Number: 28 Place of Service:  ALF [phone]) Provider:  Tyquez Hollibaugh, Manxie  NP  [name], MD  Patient Care Team: [name], MD as PCP - General (Internal Medicine) Coady Train Otho Darner, NP as Nurse Practitioner (Internal Medicine)  Extended Emergency Contact Information Primary Emergency Contact: [facility] Address: [place] [address] [name] of [place] Phone: [phone] Work Phone: [phone] Mobile Phone: [phone] Relation: Son Secondary Emergency Contact: Melida Quitter [place] of [place] Phone: [phone] Relation: Daughter  Code Status:  DNR Goals of care: Advanced Directive information Advanced Directives 01/13/2017  Does Patient Have a Medical Advance Directive? Yes  Type of Advance Directive Out of facility DNR (pink MOST or yellow form)  Does patient want to make changes to medical advance directive? No - Patient declined  Copy of [place] in Chart? -  Pre-existing out of facility DNR order (yellow form or pink MOST form) Yellow form placed in chart (order not valid for inpatient use)     Chief Complaint  Patient presents with  . Acute Visit    Family request medication (Xanax) for anxiety , mood swings    HPI:  Pt is a 82 y.o. female seen today for an acute visit for anxiety, pacing, unhappy to be at [facility], wants to go home, denied insomnia, pain, or crying inappropriately.  Hx of Afib, heart rate is in control, not on meds, s/p pacemaker, constipation, stable, taking Senokot I daily. OCD, off Risperdal, off Zoloft, memory lapses noted, started on Aricept.    Past Medical History:  Diagnosis Date  . Breast cancer ([facility]) 1996   right side  . Dementia    forgetful  . FH: mastectomy    right side   . Hyponatremia   . Lichen planus   . Osteoporosis   . Pacemaker   . Sinus node dysfunction (HCC)    with syncope; status post  implantation of a St. Jude Medical Zephyr XL model 5826 dual-chamber pacemaker  November 27, 2008.  . Subarachnoid hemorrhage following injury ([facility])    right frontal, following traumatic brain injury   Past Surgical History:  Procedure Laterality Date  . ABDOMINAL HYSTERECTOMY    . APPENDECTOMY    . BREAST ENHANCEMENT SURGERY Right 1997   By [place]  . CESAREAN SECTION  1966  . MASTECTOMY Right 1996   By T. Davis  . PACEMAKER INSERTION  11/27/08   by Greggory Brandy ([facility])  . TONSILLECTOMY      Allergies  Allergen Reactions  . Actonel [Risedronate Sodium]   . Aspirin   . Caffeine   . Calcium   . Codeine   . Evista [Raloxifene]   . Fosamax [Alendronate Sodium]   . Morphine   . Penicillins   . Prednisone   . Propoxyphene Hcl   . Propoxyphene N-Acetaminophen   . Sulfa Antibiotics     Allergies as of 01/13/2017      Reactions   Actonel [risedronate Sodium]    Aspirin    Caffeine    Calcium    Codeine    Evista [raloxifene]    Fosamax [alendronate Sodium]    Morphine    Penicillins    Prednisone    Propoxyphene Hcl    Propoxyphene N-acetaminophen    Sulfa Antibiotics       Medication List       Accurate  as of 01/13/17  3:01 PM. Always use your most recent med list.          ASPERCREME W/LIDOCAINE 4 % cream Generic drug:  lidocaine Apply 1 application topically. Apply to right ankle and RLE four times a day as needed for pain   cholecalciferol 1000 units tablet Commonly known as:  VITAMIN D Take 2,000 Units by mouth. Take 2 daily   donepezil 10 MG tablet Commonly known as:  ARICEPT Take 10 mg by mouth at bedtime.   FISH OIL PO Take 1 tablet by mouth daily. Pt unsure of dosage   hydrocortisone 1 % ointment Apply 1 application topically 2 (two) times daily as needed for itching.   multivitamin with minerals Tabs tablet Take 1 tablet by mouth daily.   nystatin powder Commonly known as:  MYCOSTATIN/NYSTOP Apply topically. Apply power to urogenital area twice  daily   omeprazole 20 MG capsule Commonly known as:  PRILOSEC Take 20 mg by mouth daily.   senna 8.6 MG tablet Commonly known as:  SENOKOT Take 1 tablet by mouth. Take one at bedtime       Review of Systems  Constitutional: Negative for activity change, appetite change, chills, diaphoresis, fatigue, fever and unexpected weight change.       Breast cancer in 1996  HENT: Positive for hearing loss (Mild hearing loss), sinus pressure and sore throat. Negative for congestion, ear discharge, ear pain, postnasal drip, rhinorrhea, tinnitus, trouble swallowing and voice change.   Eyes: Positive for visual disturbance (Corrective lenses). Negative for pain, redness and itching.  Respiratory: Positive for cough (bronchiall rattle). Negative for choking, shortness of breath and wheezing.   Cardiovascular: Negative for chest pain, palpitations and leg swelling.  Gastrointestinal: Negative for abdominal distention, abdominal pain, constipation, diarrhea and nausea.       Flatulence  Endocrine: Negative for cold intolerance, heat intolerance, polydipsia, polyphagia and polyuria.  Genitourinary: Negative for difficulty urinating, dysuria, flank pain, frequency, hematuria, pelvic pain, urgency and vaginal discharge.  Musculoskeletal: Negative for arthralgias, back pain, gait problem, myalgias, neck pain and neck stiffness.  Skin: Negative for color change, pallor and rash.       Chronic lichen planus with itching and sometimes a rash. C/o burning in urogenital area, noted redness.   Allergic/Immunologic: Negative.   Neurological: Negative for dizziness, tremors, seizures, syncope, weakness, numbness and headaches.       Increasing memory loss and confusion  Hematological: Negative for adenopathy. Bruises/bleeds easily.  Psychiatric/Behavioral: Negative for agitation, behavioral problems, confusion, dysphoric mood, hallucinations, sleep disturbance and suicidal ideas. The patient is nervous/anxious. The  patient is not hyperactive.     Immunization History  Administered Date(s) Administered  . Influenza-Unspecified 12/29/2014, 10/09/2016  . Pneumococcal-Unspecified 09/28/2008  . Tdap 08/03/2012   Pertinent  Health Maintenance Due  Topic Date Due  . DEXA SCAN  05/20/2002  . PNA vac Low Risk Adult (2 of 2 - PCV13) 09/28/2009  . MAMMOGRAM  05/09/2017  . INFLUENZA VACCINE  Completed   Fall Risk  03/25/2016  Falls in the past year? No   Functional Status Survey:    Vitals:   01/13/17 1402  BP: 114/86  Pulse: 71  Resp: 14  Temp: 97.4 F (36.3 C)  SpO2: 97%  Weight: 111 lb 3.2 oz (50.4 kg)  Height: 5\' 5"  (1.651 m)   Body mass index is 18.5 kg/m. Physical Exam  Constitutional: She appears well-developed and well-nourished. No distress.  Slightly flushed  HENT:  Right Ear: External  ear normal.  Left Ear: External ear normal.  Nose: Nose normal.  Mouth/Throat: Oropharynx is clear and moist. No oropharyngeal exudate.  Eyes: Conjunctivae and EOM are normal. Pupils are equal, round, and reactive to light. No scleral icterus.  Neck: No JVD present. No tracheal deviation present. No thyromegaly present.  Cardiovascular: Normal rate, regular rhythm and normal heart sounds.  Exam reveals no gallop and no friction rub.   No murmur heard. Pacemaker left upper chest wall. Diminished DP and PT bilaterally.  Pulmonary/Chest: Effort normal. No respiratory distress. She has no wheezes. She has rales. She exhibits no tenderness.  Coughing. Bronchial rattle.  Abdominal: She exhibits no distension and no mass. There is no tenderness.  Musculoskeletal: Normal range of motion. She exhibits no edema or tenderness.  Lymphadenopathy:    She has no cervical adenopathy.  Neurological: She is alert. No cranial nerve deficit. Coordination normal.  Moderate memory loss. MMSE 18/30. Failed clock drawing.  Skin: No rash noted. She is not diaphoretic. No erythema. No pallor.  Dry rash in localized  areas consistent with lichen planus. C/o burning in urogenital area, noted redness.   Psychiatric: Her behavior is normal. Judgment and thought content normal.  Mild anxiety    Labs reviewed:  Recent Labs  09/08/16 09/18/16 10/30/16  NA 137 139 138  K 4.8 3.5 4.2  BUN 11 16 14   CREATININE 0.8 0.7 0.7    Recent Labs  06/30/16 09/08/16 10/30/16  AST 24 24 30   ALT 23 21 26   ALKPHOS 39 47 41    Recent Labs  09/08/16 09/18/16 10/30/16  WBC 6.7 9.1 4.9  HGB 13.5 14.3 14.2  HCT 40 42 42  PLT 164 249 153   Lab Results  Component Value Date   TSH 2.47 08/18/2016   Lab Results  Component Value Date   HGBA1C 5.2 12/11/2016   Lab Results  Component Value Date   CHOL 207 (A) 12/11/2016   HDL 56 12/11/2016   LDLCALC 138 12/11/2016   TRIG 64 12/11/2016    Significant Diagnostic Results in last 30 days:  No results found.  Assessment/Plan Memory change AL for care assistance. Repetitive, memory lapses. Started Aricept, observe the patient.   OCD (obsessive compulsive disorder) 10/07/16 Psych: dc Sertraline. Hx of paranoia, pacing, anorexia 12/24/16 POA requested to dc Risperdal 01/13/17 POA request: Alprazolam 0.125mg  bid  Constipation Stable, continue Senokot I qhs   GERD (gastroesophageal reflux disease) This may contribute to her on and off cough and raspy voice, improved, continue Omeprazole 20mg  daily. Observe the patient.       Family/ staff Communication: AL  Labs/tests ordered:  none

## 2017-01-13 NOTE — Assessment & Plan Note (Signed)
Stable, continue Senokot I qhs

## 2017-01-13 NOTE — Assessment & Plan Note (Signed)
AL for care assistance. Repetitive, memory lapses. Started Aricept, observe the patient.

## 2017-01-19 ENCOUNTER — Encounter (HOSPITAL_COMMUNITY): Payer: Self-pay | Admitting: Emergency Medicine

## 2017-01-19 ENCOUNTER — Emergency Department (HOSPITAL_COMMUNITY)
Admission: EM | Admit: 2017-01-19 | Discharge: 2017-01-19 | Disposition: A | Discharge status: Home / Self Care | Payer: Medicare Other | Attending: Emergency Medicine | Admitting: Emergency Medicine

## 2017-01-19 DIAGNOSIS — Z853 Personal history of malignant neoplasm of breast: Secondary | ICD-10-CM | POA: Insufficient documentation

## 2017-01-19 DIAGNOSIS — Z950 Presence of cardiac pacemaker: Secondary | ICD-10-CM | POA: Insufficient documentation

## 2017-01-19 DIAGNOSIS — Z79899 Other long term (current) drug therapy: Secondary | ICD-10-CM | POA: Diagnosis not present

## 2017-01-19 DIAGNOSIS — Z87891 Personal history of nicotine dependence: Secondary | ICD-10-CM | POA: Diagnosis not present

## 2017-01-19 DIAGNOSIS — F29 Unspecified psychosis not due to a substance or known physiological condition: Secondary | ICD-10-CM | POA: Diagnosis not present

## 2017-01-19 DIAGNOSIS — F0390 Unspecified dementia without behavioral disturbance: Secondary | ICD-10-CM | POA: Diagnosis not present

## 2017-01-19 DIAGNOSIS — F0391 Unspecified dementia with behavioral disturbance: Secondary | ICD-10-CM

## 2017-01-19 DIAGNOSIS — R451 Restlessness and agitation: Secondary | ICD-10-CM | POA: Diagnosis not present

## 2017-01-19 LAB — URINALYSIS, ROUTINE W REFLEX MICROSCOPIC
Bilirubin Urine: NEGATIVE
Glucose, UA: NEGATIVE mg/dL
Hgb urine dipstick: NEGATIVE
Ketones, ur: NEGATIVE mg/dL
Leukocytes, UA: NEGATIVE
Nitrite: NEGATIVE
Protein, ur: NEGATIVE mg/dL
Specific Gravity, Urine: 1.005 (ref 1.005–1.030)
pH: 7 (ref 5.0–8.0)

## 2017-01-19 LAB — COMPREHENSIVE METABOLIC PANEL
ALT: 23 U/L (ref 14–54)
AST: 27 U/L (ref 15–41)
Albumin: 4.3 g/dL (ref 3.5–5.0)
Alkaline Phosphatase: 42 U/L (ref 38–126)
Anion gap: 9 (ref 5–15)
BUN: 13 mg/dL (ref 6–20)
CO2: 27 mmol/L (ref 22–32)
Calcium: 9.4 mg/dL (ref 8.9–10.3)
Chloride: 102 mmol/L (ref 101–111)
Creatinine, Ser: 0.54 mg/dL (ref 0.44–1.00)
GFR calc Af Amer: >60 mL/min (ref >60)
GFR calc non Af Amer: >60 mL/min (ref >60)
Glucose, Bld: 104 mg/dL — ABNORMAL HIGH (ref 65–99)
Potassium: 3.7 mmol/L (ref 3.5–5.1)
Sodium: 138 mmol/L (ref 135–145)
Total Bilirubin: 0.6 mg/dL (ref 0.3–1.2)
Total Protein: 7.2 g/dL (ref 6.5–8.1)

## 2017-01-19 LAB — CBC WITH DIFFERENTIAL/PLATELET
Basophils Absolute: 0 10*3/uL (ref 0.0–0.1)
Basophils Relative: 0 %
Eosinophils Absolute: 0.1 10*3/uL (ref 0.0–0.7)
Eosinophils Relative: 1 %
HCT: 40.4 % (ref 36.0–46.0)
Hemoglobin: 13.7 g/dL (ref 12.0–15.0)
Lymphocytes Relative: 17 %
Lymphs Abs: 1.1 10*3/uL (ref 0.7–4.0)
MCH: 30.4 pg (ref 26.0–34.0)
MCHC: 33.9 g/dL (ref 30.0–36.0)
MCV: 89.8 fL (ref 78.0–100.0)
Monocytes Absolute: 0.8 10*3/uL (ref 0.1–1.0)
Monocytes Relative: 12 %
Neutro Abs: 4.3 10*3/uL (ref 1.7–7.7)
Neutrophils Relative %: 70 %
Platelets: 169 10*3/uL (ref 150–400)
RBC: 4.5 MIL/uL (ref 3.87–5.11)
RDW: 13.2 % (ref 11.5–15.5)
WBC: 6.2 10*3/uL (ref 4.0–10.5)

## 2017-01-19 NOTE — ED Notes (Signed)
Bed: WA21 Expected date:  Expected time:  Means of arrival:  Comments: EMS- altered mental status

## 2017-01-19 NOTE — ED Provider Notes (Signed)
[facility] DEPT Provider Note   CSN: [id number] Arrival date & time: 01/19/17  1718   History   Chief Complaint Chief Complaint  Patient presents with  . Altered Mental Status   HPI [name] is a 82 y.o. female.  HPI    83 year old female [facility] today from [facility] with altered mental status. Patient has a significant past medical history of dementia. She is unable to provide any significant details. Patient notes that she wanted to go home, was walking home and was picked up by the ambulance. Patient thinks it is 2017, does not know the month or her [place] location other than the hospital. Patient denies any complaints.  I spoke with staff at [facility] reports patient was attempting to walk home. She is currently in independent living despite her obvious dementia. Staff attempted to bring her back to the facility, she was agitated and did not want to go with them. They were able to get her into the maintenance man's vehicle and bring her back, but she did not want to get out. Staff notes that this is not abnormal for her and she has had numerous episodes of the same. He denies any infectious etiology or acute change in her baseline behavior. Nursing staff notes a gave her Haldol at the facility, consult at EMS and transport to the hospital.  I spoke with patient's daughter on the phone who lives outside [place] and is not in the area. She notes that over the last several months patient has been displaying worsening dementia, and has attempted to leave her independent living facility multiple times. During Christmas she was found crossing a [place].. They have spoken with behavioral unit in [place] as they would like her to be managed inpatient with them for medication reconciliation, and behavioral evaluation. They are unable to have her transferred as they were told this is "not an emergency situation". They note that [facility] does have a assisted  living option, but they will not transfer patient there as she is technically supposed to be independent. Uncertain as to why her primary care provider Dr. Nyoka Cowden at facility is unable to help assist with this, family is frustrated.  She most recently had medication change as she was on antipsychotics which made her dementia worse, recently started on Xanax proximally 6 days ago.     Past Medical History:  Diagnosis Date  . Breast cancer ([facility]) 1996   right side  . Dementia    forgetful  . FH: mastectomy    right side   . Hyponatremia   . Lichen planus   . Osteoporosis   . Pacemaker   . Sinus node dysfunction (HCC)    with syncope; status post implantation of a St. Jude Medical Zephyr XL model 5826 dual-chamber pacemaker  November 27, 2008.  . Subarachnoid hemorrhage following injury ([facility])    right frontal, following traumatic brain injury    Patient Active Problem List   Diagnosis Date Noted  . GERD (gastroesophageal reflux disease) 10/07/2016  . OCD (obsessive compulsive disorder) 09/18/2016  . Constipation 08/13/2016  . Memory change 04/06/2016  . Osteoporosis   . Pacemaker   . Lichen planus   . Atrial fibrillation 12/12/2013  . Sinus node dysfunction ([facility]) 05/02/2011  . Hyponatremia 05/10/2010  . PERSONAL HISTORY OF TRAUMATIC BRAIN INJURY 05/10/2010    Past Surgical History:  Procedure Laterality Date  . ABDOMINAL HYSTERECTOMY    . APPENDECTOMY    . BREAST  ENHANCEMENT SURGERY Right 1997   By [place]  . CESAREAN SECTION  1966  . MASTECTOMY Right 1996   By T. Davis  . PACEMAKER INSERTION  11/27/08   by Greggory Brandy ([facility])  . TONSILLECTOMY      OB History    No data available       Home Medications    Prior to Admission medications   Medication Sig Start Date End Date Taking? Authorizing Provider  cholecalciferol (VITAMIN D) 1000 UNITS tablet Take 2,000 Units by mouth. Take 2 daily   Yes Historical Provider, MD  donepezil (ARICEPT) 10 MG tablet Take 10 mg by  mouth at bedtime.   Yes Historical Provider, MD  hydrocortisone 1 % ointment Apply 1 application topically 2 (two) times daily as needed for itching.   Yes Historical Provider, MD  Multiple Vitamin (MULITIVITAMIN WITH MINERALS) TABS Take 1 tablet by mouth daily.   Yes Historical Provider, MD  nystatin (MYCOSTATIN/NYSTOP) powder Apply topically. Apply power to urogenital area twice daily   Yes Historical Provider, MD  Omega-3 Fatty Acids (FISH OIL PO) Take 1 tablet by mouth daily. Pt unsure of dosage   Yes Historical Provider, MD  omeprazole (PRILOSEC) 20 MG capsule Take 20 mg by mouth daily.   Yes Historical Provider, MD  senna (SENOKOT) 8.6 MG tablet Take 1 tablet by mouth. Take one at bedtime   Yes Historical Provider, MD    Family History Family History  Problem Relation Age of Onset  . Heart attack Mother 82  . Stroke Father 61  . Alcohol abuse Father 50  . Alcoholism Brother 20    Social History Social History  Substance Use Topics  . Smoking status: Former Smoker    Quit date: 03/25/1996  . Smokeless tobacco: Never Used  . Alcohol use 4.2 oz/week    7 Glasses of wine per week     Allergies   Actonel [risedronate sodium]; Aspirin; Caffeine; Calcium; Codeine; Evista [raloxifene]; Fosamax [alendronate sodium]; Morphine; Penicillins; Prednisone; Propoxyphene hcl; Propoxyphene n-acetaminophen; and Sulfa antibiotics   Review of Systems Review of Systems  Unable to perform ROS: Dementia     Physical Exam Updated Vital Signs BP 160/94 (BP Location: Right Arm)   Pulse 78   Temp 97.8 F (36.6 C) (Oral)   Resp 18   SpO2 97%   Physical Exam  Constitutional: She appears well-developed and well-nourished.  HENT:  Head: Normocephalic and atraumatic.  Eyes: Conjunctivae are normal. Pupils are equal, round, and reactive to light. Right eye exhibits no discharge. Left eye exhibits no discharge. No scleral icterus.  Neck: Normal range of motion. No JVD present. No tracheal  deviation present.  Pulmonary/Chest: Effort normal. No stridor.  Neurological: She is alert. No cranial nerve deficit. Coordination normal.  Psychiatric: She has a normal mood and affect. Her behavior is normal. Judgment and thought content normal.  Nursing note and vitals reviewed.    ED Treatments / Results  Labs (all labs ordered are listed, but only abnormal results are displayed) Labs Reviewed  COMPREHENSIVE METABOLIC PANEL - Abnormal; Notable for the following:       Result Value   Glucose, Bld 104 (*)    All other components within normal limits  URINALYSIS, ROUTINE W REFLEX MICROSCOPIC - Abnormal; Notable for the following:    Color, Urine STRAW (*)    All other components within normal limits  CBC WITH DIFFERENTIAL/PLATELET    EKG  EKG Interpretation None       Radiology  No results found.  Procedures Procedures (including critical care time)  Medications Ordered in ED Medications - No data to display   Initial Impression / Assessment and Plan / ED Course  I have reviewed the triage vital signs and the nursing notes.  Pertinent labs & imaging results that were available during my care of the patient were reviewed by me and considered in my medical decision making (see chart for details).      Final Clinical Impressions(s) / ED Diagnoses   Final diagnoses:  Dementia with behavioral disturbance, unspecified dementia type    Labs: Urinalysis, CBC, CMP  Imaging:  Consults: Social work  Therapeutics:  Discharge Meds:   Assessment/Plan: 82 year old female presents today at the request of facility for evaluation. I spoke with both facility, patient's daughter, and patient's daughter-in-law. This is not acutely new behavior, this is likely worsening dementia. Patient is requesting to leave the facility. They spoke with  social work who spoke with [facility]. Patient will have 24-hour care there, and is safe for discharge home. They spoke with family  who agreed that this would be appropriate for this patient. I see no signs of acute illness that would require inpatient management or further evaluation here. She will be discharged home with transfer to [facility], and will have further follow-up care with primary care for appropriate placement.    New Prescriptions Discharge Medication List as of 01/19/2017  7:42 PM       Okey Regal, PA-C 01/19/17 2130    Varney Biles, MD 01/20/17 1549

## 2017-01-19 NOTE — ED Notes (Signed)
Patient was alert, and stable upon discharge. Pt was transported home by [facility].

## 2017-01-19 NOTE — ED Notes (Addendum)
Patient family requesting update this evening.  Patient daughter, Nollie Lujan, cell: [phone], home: [phone] Patient daughter in [place], [place], [phone]

## 2017-01-19 NOTE — ED Notes (Addendum)
Patient continues to ask to leave. Patient wandering halls stating, "I am ready to go." Patient redirected back to room.

## 2017-01-19 NOTE — ED Notes (Signed)
ED Provider at bedside. 

## 2017-01-19 NOTE — Discharge Instructions (Signed)
Please read attached information. If you experience any new or worsening signs or symptoms please return to the emergency room for evaluation. Please follow-up with your primary care provider or specialist as discussed.  °

## 2017-01-19 NOTE — Progress Notes (Signed)
CSW contacted [facility] to confirm they were able to take patient. [facility] stated patient is appropriate for their ALF until a bed at their memory care became available. CSW contacted patients relative, Peter Congo, who was agreeable to this plan. CSW updated RN and EDP.   Kingsley Spittle, LCSWA Clinical Social Worker [phone]

## 2017-01-19 NOTE — ED Triage Notes (Signed)
Per EMS, patient from [facility], hx dementia but acute mental status change. EMS called by staff, reports patient was attempting to walk across the street. Given 5mg  Haldol at facility. A&Ox3. Denies any complaints and pain. Ambulatory with EMS.

## 2017-01-22 DIAGNOSIS — I10 Essential (primary) hypertension: Secondary | ICD-10-CM | POA: Diagnosis not present

## 2017-01-22 DIAGNOSIS — R6889 Other general symptoms and signs: Secondary | ICD-10-CM | POA: Diagnosis not present

## 2017-02-06 ENCOUNTER — Non-Acute Institutional Stay: Payer: Medicare Other | Admitting: Nurse Practitioner

## 2017-02-06 ENCOUNTER — Encounter: Payer: Self-pay | Admitting: Nurse Practitioner

## 2017-02-06 DIAGNOSIS — F422 Mixed obsessional thoughts and acts: Secondary | ICD-10-CM | POA: Diagnosis not present

## 2017-02-06 DIAGNOSIS — I480 Paroxysmal atrial fibrillation: Secondary | ICD-10-CM

## 2017-02-06 DIAGNOSIS — R413 Other amnesia: Secondary | ICD-10-CM | POA: Diagnosis not present

## 2017-02-06 DIAGNOSIS — E871 Hypo-osmolality and hyponatremia: Secondary | ICD-10-CM

## 2017-02-06 DIAGNOSIS — J209 Acute bronchitis, unspecified: Secondary | ICD-10-CM | POA: Diagnosis not present

## 2017-02-06 DIAGNOSIS — K5900 Constipation, unspecified: Secondary | ICD-10-CM

## 2017-02-06 DIAGNOSIS — R05 Cough: Secondary | ICD-10-CM | POA: Diagnosis not present

## 2017-02-06 DIAGNOSIS — Z950 Presence of cardiac pacemaker: Secondary | ICD-10-CM

## 2017-02-06 DIAGNOSIS — K219 Gastro-esophageal reflux disease without esophagitis: Secondary | ICD-10-CM | POA: Diagnosis not present

## 2017-02-06 NOTE — Assessment & Plan Note (Signed)
functioning

## 2017-02-06 NOTE — Assessment & Plan Note (Signed)
10/07/16 Psych: dc Sertraline. Hx of paranoia, pacing, anorexia 12/24/16 POA requested to dc Risperdal 01/13/17 POA request: Alprazolam 0.125mg  bid 01/19/17 ED for behavioral issues 02/06/17 stable, Risperdal 0.25mg  bid and Alprazolam bid

## 2017-02-06 NOTE — Progress Notes (Signed)
Location:  [facility] Room Number: 28 Place of Service:  [phone]) Provider:  Garry Nicolini, [place]  NP  [name], MD  Patient Care Team: [name], MD as PCP - General (Internal Medicine) Tagg Eustice Otho Darner, NP as Nurse Practitioner (Internal Medicine)  Extended Emergency Contact Information Primary Emergency Contact: [facility] Address: [place] [address] [name] of [place] Phone: [phone] Work Phone: [phone] Mobile Phone: [phone] Relation: Son Secondary Emergency Contact: Melida Quitter [place] of [place] Phone: [phone] Relation: Daughter  Code Status:  DNR Goals of care: Advanced Directive information Advanced Directives 02/06/2017  Does Patient Have a Medical Advance Directive? Yes  Type of Advance Directive Out of facility DNR (pink MOST or yellow form);Healthcare Power of Attorney  Does patient want to make changes to medical advance directive? No - Patient declined  Copy of [place] in Chart? Yes  Pre-existing out of facility DNR order (yellow form or pink MOST form) Yellow form placed in chart (order not valid for inpatient use)     Chief Complaint  Patient presents with  . Acute Visit    Itchy throat, cough    HPI:  Pt is a 82 y.o. female seen today for an acute visit for Productive cough, pain with cough in the central front chest area x a few days, she is afebrile, no O2 desaturation, c/o sore throat, mild erythema seen.  ED eval 01/19/17 dementia with behavioral issues  Hx of dementia, better managed since Risperdal and Xanax. Taking Aricept for memory. Heart rate is in control, not on rhythm agent.     Past Medical History:  Diagnosis Date  . Breast cancer ([place]) 1996   right side  . Dementia    forgetful  . FH: mastectomy    right side   . Hyponatremia   . Lichen planus   . Osteoporosis   . Pacemaker   . Sinus node dysfunction (HCC)     with syncope; status post implantation of a [employer]. [employer] Medical Zephyr XL model 5826 dual-chamber pacemaker  November 27, 2008.  . Subarachnoid hemorrhage following injury ([place])    right frontal, following traumatic brain injury   Past Surgical History:  Procedure Laterality Date  . ABDOMINAL HYSTERECTOMY    . APPENDECTOMY    . BREAST ENHANCEMENT SURGERY Right 1997   By [place]  . CESAREAN SECTION  1966  . MASTECTOMY Right 1996   By T. Davis  . PACEMAKER INSERTION  11/27/08   by Greggory Brandy (STM)  . TONSILLECTOMY      Allergies  Allergen Reactions  . Actonel [Risedronate Sodium]   . Aspirin   . Caffeine   . Calcium   . Codeine   . Evista [Raloxifene]   . Fosamax [Alendronate Sodium]   . Morphine   . Penicillins     Per MAR  . Prednisone   . Propoxyphene Hcl   . Propoxyphene N-Acetaminophen   . Sulfa Antibiotics     Allergies as of 02/06/2017      Reactions   Actonel [risedronate Sodium]    Aspirin    Caffeine    Calcium    Codeine    Evista [raloxifene]    Fosamax [alendronate Sodium]    Morphine    Penicillins    Per MAR   Prednisone    Propoxyphene Hcl    Propoxyphene N-acetaminophen    Sulfa  Antibiotics       Medication List       Accurate as of 02/06/17  3:21 PM. Always use your most recent med list.          ALPRAZolam 0.25 MG tablet Commonly known as:  XANAX Take 0.25 mg by mouth 2 (two) times daily.   amoxicillin-clavulanate 875-125 MG tablet Commonly known as:  AUGMENTIN Take 1 tablet by mouth 2 (two) times daily.   CHLORASEPTIC 6-10 MG lozenge Generic drug:  benzocaine-menthol Take 1 lozenge by mouth as needed for sore throat.   cholecalciferol 1000 units tablet Commonly known as:  VITAMIN D Take 2,000 Units by mouth. Take 2 daily   donepezil 10 MG tablet Commonly known as:  ARICEPT Take 10 mg by mouth at bedtime.   FISH OIL PO Take 1 tablet by mouth daily. Pt unsure of dosage   guaiFENesin 600 MG 12 hr tablet Commonly known as:   MUCINEX Take 600 mg by mouth 2 (two) times daily.   hydrocortisone 1 % ointment Apply 1 application topically 2 (two) times daily as needed for itching.   loratadine 10 MG tablet Commonly known as:  CLARITIN Take 10 mg by mouth daily.   multivitamin with minerals Tabs tablet Take 1 tablet by mouth daily.   nystatin powder Commonly known as:  MYCOSTATIN/NYSTOP Apply topically. Apply power to urogenital area twice daily   omeprazole 20 MG capsule Commonly known as:  PRILOSEC Take 20 mg by mouth daily.   risperiDONE 0.25 MG tablet Commonly known as:  RISPERDAL Take 0.25 mg by mouth 2 (two) times daily.   saccharomyces boulardii 250 MG capsule Commonly known as:  FLORASTOR Take 250 mg by mouth 2 (two) times daily.   senna 8.6 MG tablet Commonly known as:  SENOKOT Take 1 tablet by mouth. Take one at bedtime       Review of Systems  Constitutional: Negative for activity change, appetite change, chills, diaphoresis, fatigue, fever and unexpected weight change.       Breast cancer in 1996  HENT: Positive for congestion, hearing loss (Mild hearing loss), sinus pressure and sore throat. Negative for ear discharge, ear pain, postnasal drip, rhinorrhea, tinnitus, trouble swallowing and voice change.   Eyes: Positive for visual disturbance (Corrective lenses). Negative for pain, redness and itching.  Respiratory: Negative for choking, shortness of breath and wheezing. Cough: bronchiall rattle.   Cardiovascular: Negative for chest pain, palpitations and leg swelling.  Gastrointestinal: Negative for abdominal distention, abdominal pain, constipation, diarrhea and nausea.       Flatulence  Endocrine: Negative for cold intolerance, heat intolerance, polydipsia, polyphagia and polyuria.  Genitourinary: Negative for difficulty urinating, dysuria, flank pain, frequency, hematuria, pelvic pain, urgency and vaginal discharge.  Musculoskeletal: Negative for arthralgias, back pain, gait  problem, myalgias, neck pain and neck stiffness.  Skin: Negative for color change, pallor and rash.       Chronic lichen planus with itching and sometimes a rash. C/o burning in urogenital area, noted redness.   Allergic/Immunologic: Negative.   Neurological: Negative for dizziness, tremors, seizures, syncope, weakness, numbness and headaches.       Increasing memory loss and confusion  Hematological: Negative for adenopathy. Bruises/bleeds easily.  Psychiatric/Behavioral: Negative for agitation, behavioral problems, confusion, dysphoric mood, hallucinations, sleep disturbance and suicidal ideas. The patient is nervous/anxious. The patient is not hyperactive.     Immunization History  Administered Date(s) Administered  . Influenza-Unspecified 12/29/2014, 10/09/2016  . Pneumococcal-Unspecified 09/28/2008  . Tdap 08/03/2012  Pertinent  Health Maintenance Due  Topic Date Due  . DEXA SCAN  05/20/2002  . PNA vac Low Risk Adult (2 of 2 - PCV13) 09/28/2009  . MAMMOGRAM  05/09/2017  . INFLUENZA VACCINE  Completed   Fall Risk  03/25/2016  Falls in the past year? No   Functional Status Survey:    Vitals:   02/06/17 1317  BP: 108/78  Pulse: 77  Resp: 18  Temp: 97.2 F (36.2 C)  SpO2: 97%  Weight: 110 lb 12.8 oz (50.3 kg)  Height: 5\' 5"  (1.651 m)   Body mass index is 18.44 kg/m. Physical Exam  Constitutional: She appears well-developed and well-nourished. No distress.  Slightly flushed  HENT:  Right Ear: External ear normal.  Left Ear: External ear normal.  Nose: Nose normal.  Mouth/Throat: Oropharynx is clear and moist. No oropharyngeal exudate.  Eyes: Conjunctivae and EOM are normal. Pupils are equal, round, and reactive to light. No scleral icterus.  Neck: No JVD present. No tracheal deviation present. No thyromegaly present.  Cardiovascular: Normal rate, regular rhythm and normal heart sounds.  Exam reveals no gallop and no friction rub.   No murmur heard. Pacemaker left  upper chest wall. Diminished DP and PT bilaterally.  Pulmonary/Chest: Effort normal. No respiratory distress. She has no wheezes. She has rales. She exhibits no tenderness.  Coughing. Bronchial rattle.  Abdominal: She exhibits no distension and no mass. There is no tenderness.  Musculoskeletal: Normal range of motion. She exhibits no edema or tenderness.  Lymphadenopathy:    She has no cervical adenopathy.  Neurological: She is alert. No cranial nerve deficit. Coordination normal.  Moderate memory loss. MMSE 18/30. Failed clock drawing.  Skin: No rash noted. She is not diaphoretic. No erythema. No pallor.  Dry rash in localized areas consistent with lichen planus. C/o burning in urogenital area, noted redness.   Psychiatric: Her behavior is normal. Judgment and thought content normal.  Mild anxiety    Labs reviewed:  Recent Labs  09/18/16 10/30/16 01/19/17 1831  NA 139 138 138  K 3.5 4.2 3.7  CL  --   --  102  CO2  --   --  27  GLUCOSE  --   --  104*  BUN 16 14 13   CREATININE 0.7 0.7 0.54  CALCIUM  --   --  9.4    Recent Labs  09/08/16 10/30/16 01/19/17 1831  AST 24 30 27   ALT 21 26 23   ALKPHOS 47 41 42  BILITOT  --   --  0.6  PROT  --   --  7.2  ALBUMIN  --   --  4.3    Recent Labs  09/18/16 10/30/16 01/19/17 1831  WBC 9.1 4.9 6.2  NEUTROABS  --   --  4.3  HGB 14.3 14.2 13.7  HCT 42 42 40.4  MCV  --   --  89.8  PLT 249 153 169   Lab Results  Component Value Date   TSH 2.47 08/18/2016   Lab Results  Component Value Date   HGBA1C 5.2 12/11/2016   Lab Results  Component Value Date   CHOL 207 (A) 12/11/2016   HDL 56 12/11/2016   LDLCALC 138 12/11/2016   TRIG 64 12/11/2016    Significant Diagnostic Results in last 30 days:  No results found.  Assessment/Plan Acute bronchitis Productive cough, pain with cough in the central front chest area x a few days, she is afebrile, no O2 desaturation, c/o sore throat,  mild erythema seen. Levaquin 500mg  qd and  Mucinex 600mg  bid x 7 days. CXR to r/o PNA  Atrial fibrillation Heart rate is in control, not on rhythm agent.    Constipation Stable, continue Senokot I qhs  GERD (gastroesophageal reflux disease) This may contribute to her on and off cough and raspy voice, improved, continue Omeprazole 20mg  daily. Observe the patient.    Hyponatremia Last Na 138 10/30/16  Memory change AL for care assistance. Repetitive, memory lapses. Tolerated  Aricept, observe the patient.   Pacemaker functioning  OCD (obsessive compulsive disorder) 10/07/16 Psych: dc Sertraline. Hx of paranoia, pacing, anorexia 12/24/16 POA requested to dc Risperdal 01/13/17 POA request: Alprazolam 0.125mg  bid 01/19/17 ED for behavioral issues 02/06/17 stable, Risperdal 0.25mg  bid and Alprazolam bid     Family/ staff Communication: AL  Labs/tests ordered:  CXR

## 2017-02-06 NOTE — Assessment & Plan Note (Signed)
Heart rate is in control, not on rhythm agent.

## 2017-02-06 NOTE — Assessment & Plan Note (Signed)
AL for care assistance. Repetitive, memory lapses. Tolerated  Aricept, observe the patient.

## 2017-02-06 NOTE — Assessment & Plan Note (Signed)
This may contribute to her on and off cough and raspy voice, improved, continue Omeprazole 20mg  daily. Observe the patient.

## 2017-02-06 NOTE — Assessment & Plan Note (Signed)
Productive cough, pain with cough in the central front chest area x a few days, she is afebrile, no O2 desaturation, c/o sore throat, mild erythema seen. Levaquin 500mg  qd and Mucinex 600mg  bid x 7 days. CXR to r/o PNA

## 2017-02-06 NOTE — Assessment & Plan Note (Signed)
Last Na 138 10/30/16

## 2017-02-06 NOTE — Assessment & Plan Note (Signed)
Stable, continue Senokot I qhs

## 2017-03-16 ENCOUNTER — Encounter: Payer: Self-pay | Admitting: Internal Medicine

## 2017-03-16 ENCOUNTER — Ambulatory Visit (INDEPENDENT_AMBULATORY_CARE_PROVIDER_SITE_OTHER): Payer: Medicare Other | Admitting: Internal Medicine

## 2017-03-16 ENCOUNTER — Telehealth: Payer: Self-pay | Admitting: *Deleted

## 2017-03-16 VITALS — BP 104/68 | HR 75 | Ht 65.0 in | Wt 109.8 lb

## 2017-03-16 DIAGNOSIS — Z950 Presence of cardiac pacemaker: Secondary | ICD-10-CM | POA: Diagnosis not present

## 2017-03-16 DIAGNOSIS — I495 Sick sinus syndrome: Secondary | ICD-10-CM

## 2017-03-16 DIAGNOSIS — I480 Paroxysmal atrial fibrillation: Secondary | ICD-10-CM

## 2017-03-16 DIAGNOSIS — H43393 Other vitreous opacities, bilateral: Secondary | ICD-10-CM | POA: Diagnosis not present

## 2017-03-16 LAB — CUP PACEART INCLINIC DEVICE CHECK
Battery Impedance: 1300 Ohm
Battery Voltage: 2.79 V
Date Time Interrogation Session: 20180319143248
Implantable Lead Implant Date: 20091130
Implantable Lead Implant Date: 20091130
Implantable Lead Location: 753859
Implantable Lead Location: 753860
Implantable Pulse Generator Implant Date: 20091130
Lead Channel Impedance Value: 395 Ohm
Lead Channel Impedance Value: 466 Ohm
Lead Channel Pacing Threshold Amplitude: 0.5 V
Lead Channel Pacing Threshold Amplitude: 0.5 V
Lead Channel Pacing Threshold Pulse Width: 0.4 ms
Lead Channel Pacing Threshold Pulse Width: 0.4 ms
Lead Channel Sensing Intrinsic Amplitude: 1.7 mV
Lead Channel Sensing Intrinsic Amplitude: 7.5 mV
Lead Channel Setting Pacing Amplitude: 2 V
Lead Channel Setting Pacing Pulse Width: 0.4 ms
Lead Channel Setting Sensing Sensitivity: 2 mV
Pulse Gen Model: 5826
Pulse Gen Serial Number: 2228688

## 2017-03-16 NOTE — Progress Notes (Signed)
[name] is a 82 y.o. female who presents today for routine electrophysiology followup.  Since last being seen in our clinic, the patient is having advancement of her dementia.  She is pleasant today.  Today, she denies symptoms of  chest pain, shortness of breath,  lower extremity edema, dizziness, presyncope, or syncope.  The patient is otherwise without complaint today.   Past Medical History:  Diagnosis Date  . Breast cancer ([place]) 1996   right side  . Dementia    forgetful  . FH: mastectomy    right side   . Hyponatremia   . Lichen planus   . Osteoporosis   . Pacemaker   . Sinus node dysfunction (HCC)    with syncope; status post implantation of a St. Jude Medical Zephyr XL model 5826 dual-chamber pacemaker  November 27, 2008.  . Subarachnoid hemorrhage following injury ([place])    right frontal, following traumatic brain injury   Past Surgical History:  Procedure Laterality Date  . ABDOMINAL HYSTERECTOMY    . APPENDECTOMY    . BREAST ENHANCEMENT SURGERY Right 1997   By [place]  . CESAREAN SECTION  1966  . MASTECTOMY Right 1996   By T. Davis  . PACEMAKER INSERTION  11/27/08   by Greggory Brandy (STM)  . TONSILLECTOMY      Current Outpatient Prescriptions  Medication Sig Dispense Refill  . ALPRAZolam (XANAX) 0.25 MG tablet Take 0.25 mg by mouth 2 (two) times daily.    Marland Kitchen amoxicillin-clavulanate (AUGMENTIN) 875-125 MG tablet Take 1 tablet by mouth 2 (two) times daily.    . benzocaine-menthol (CHLORASEPTIC) 6-10 MG lozenge Take 1 lozenge by mouth as needed for sore throat (As directed).     . cholecalciferol (VITAMIN D) 1000 UNITS tablet Take 2,000 Units by mouth. Take 2 daily    . donepezil (ARICEPT) 10 MG tablet Take 10 mg by mouth at bedtime.    Marland Kitchen guaiFENesin (MUCINEX) 600 MG 12 hr tablet Take 600 mg by mouth 2 (two) times daily.    . hydrocortisone 1 % ointment Apply 1 application topically 2 (two) times daily as needed for itching.    . loratadine (CLARITIN) 10 MG tablet  Take 10 mg by mouth daily.    . Multiple Vitamin (MULITIVITAMIN WITH MINERALS) TABS Take 1 tablet by mouth daily.    Marland Kitchen nystatin (MYCOSTATIN/NYSTOP) powder Apply topically as directed. Apply power to urogenital area twice daily     . Omega-3 Fatty Acids (FISH OIL PO) Take 1 tablet by mouth daily.     Marland Kitchen omeprazole (PRILOSEC) 20 MG capsule Take 20 mg by mouth daily.    . risperiDONE (RISPERDAL) 0.25 MG tablet Take 0.25 mg by mouth 2 (two) times daily.    Marland Kitchen saccharomyces boulardii (FLORASTOR) 250 MG capsule Take 250 mg by mouth 2 (two) times daily.    Marland Kitchen senna (SENOKOT) 8.6 MG tablet Take 1 tablet by mouth at bedtime.      No current facility-administered medications for this visit.     Physical Exam: Vitals:   03/16/17 1400  BP: 104/68  Pulse: 75  SpO2: 95%  Weight: 109 lb 12.8 oz (49.8 kg)  Height: 5\' 5"  (1.651 m)    GEN- The patient is well appearing, alert and pleasant Head- normocephalic, atraumatic Eyes-  Sclera clear, conjunctiva pink, OP clear Ears- hearing intact Oropharynx- clear Lungs- Clear to ausculation bilaterally, normal work of breathing Chest- pacemaker pocket is well healed Heart- Regular rate and rhythm, no murmurs, rubs  or gallops, PMI not laterally displaced GI- soft, NT, ND, + BS Extremities- no clubbing, cyanosis, or edema  Pacemaker interrogation- personally reviewed in detail today,  See PACEART report  Assessment and Plan:  1. Sinus node dysfunction Normal pacemaker function See Pace Art report No changes today  2. afib Known afib Not a candidate for anticoagulation  Given advanced dementia, frequent visits are difficult.  She will return every year to see EP NP  Thompson Grayer MD, [facility] 03/16/2017 2:11 PM

## 2017-03-16 NOTE — Patient Instructions (Addendum)
Medication Instructions: - Your physician recommends that you continue on your current medications as directed. Please refer to the Current Medication list given to you today.  Labwork: - none ordered  Procedures/Testing: - none ordered  Follow-Up: - Your physician wants you to follow-up in: 1 year with Chanetta Marshall, NP for Dr. Rayann Heman. You will receive a reminder letter in the mail two months in advance. If you don't receive a letter, please call our office to schedule the follow-up appointment.  Any Additional Special Instructions Will Be Listed Below (If Applicable).     If you need a refill on your cardiac medications before your next appointment, please call your pharmacy.

## 2017-03-16 NOTE — Telephone Encounter (Signed)
Natale Milch with [facility] called and wanted Clarification on the [facility] Form received. I instructed her to call [facility] AL because patient is seen there. She agreed.

## 2017-03-18 DIAGNOSIS — F419 Anxiety disorder, unspecified: Secondary | ICD-10-CM | POA: Diagnosis not present

## 2017-03-18 DIAGNOSIS — M810 Age-related osteoporosis without current pathological fracture: Secondary | ICD-10-CM | POA: Diagnosis not present

## 2017-03-18 DIAGNOSIS — K5900 Constipation, unspecified: Secondary | ICD-10-CM | POA: Diagnosis not present

## 2017-03-18 DIAGNOSIS — F0390 Unspecified dementia without behavioral disturbance: Secondary | ICD-10-CM | POA: Diagnosis not present

## 2017-03-18 DIAGNOSIS — E559 Vitamin D deficiency, unspecified: Secondary | ICD-10-CM | POA: Diagnosis not present

## 2017-03-18 DIAGNOSIS — K219 Gastro-esophageal reflux disease without esophagitis: Secondary | ICD-10-CM | POA: Diagnosis not present

## 2017-03-18 DIAGNOSIS — E785 Hyperlipidemia, unspecified: Secondary | ICD-10-CM | POA: Diagnosis not present

## 2017-03-24 ENCOUNTER — Encounter: Payer: Self-pay | Admitting: Internal Medicine

## 2017-03-25 DIAGNOSIS — M810 Age-related osteoporosis without current pathological fracture: Secondary | ICD-10-CM | POA: Diagnosis not present

## 2017-03-25 DIAGNOSIS — E559 Vitamin D deficiency, unspecified: Secondary | ICD-10-CM | POA: Diagnosis not present

## 2017-03-25 DIAGNOSIS — F0390 Unspecified dementia without behavioral disturbance: Secondary | ICD-10-CM | POA: Diagnosis not present

## 2017-03-25 DIAGNOSIS — E785 Hyperlipidemia, unspecified: Secondary | ICD-10-CM | POA: Diagnosis not present

## 2017-03-25 DIAGNOSIS — K219 Gastro-esophageal reflux disease without esophagitis: Secondary | ICD-10-CM | POA: Diagnosis not present

## 2017-03-25 DIAGNOSIS — K5900 Constipation, unspecified: Secondary | ICD-10-CM | POA: Diagnosis not present

## 2017-03-25 DIAGNOSIS — F419 Anxiety disorder, unspecified: Secondary | ICD-10-CM | POA: Diagnosis not present

## 2017-04-01 DIAGNOSIS — F330 Major depressive disorder, recurrent, mild: Secondary | ICD-10-CM | POA: Diagnosis not present

## 2017-04-01 DIAGNOSIS — F0391 Unspecified dementia with behavioral disturbance: Secondary | ICD-10-CM | POA: Diagnosis not present

## 2017-04-01 DIAGNOSIS — F064 Anxiety disorder due to known physiological condition: Secondary | ICD-10-CM | POA: Diagnosis not present

## 2017-04-06 ENCOUNTER — Telehealth: Payer: Self-pay | Admitting: *Deleted

## 2017-04-06 NOTE — Telephone Encounter (Signed)
Patient's nurse called to state that family brought in home transmitter box.  Clarified that this is TTM equipment.  Advised that per most recent OV with Dr. Rayann Heman, patient will be seen yearly by our office due to advanced dementia.  Jenel Lucks that she does not have to use TTM equipment as patient has not used it recently.  Advised that next OV will be due in 02/2018.  [name] verbalizes understanding and denies additional questions or concerns at this time.

## 2017-04-23 DIAGNOSIS — K5900 Constipation, unspecified: Secondary | ICD-10-CM | POA: Diagnosis not present

## 2017-04-23 DIAGNOSIS — E559 Vitamin D deficiency, unspecified: Secondary | ICD-10-CM | POA: Diagnosis not present

## 2017-04-23 DIAGNOSIS — F419 Anxiety disorder, unspecified: Secondary | ICD-10-CM | POA: Diagnosis not present

## 2017-04-23 DIAGNOSIS — E785 Hyperlipidemia, unspecified: Secondary | ICD-10-CM | POA: Diagnosis not present

## 2017-04-23 DIAGNOSIS — F0390 Unspecified dementia without behavioral disturbance: Secondary | ICD-10-CM | POA: Diagnosis not present

## 2017-04-23 DIAGNOSIS — M810 Age-related osteoporosis without current pathological fracture: Secondary | ICD-10-CM | POA: Diagnosis not present

## 2017-04-23 DIAGNOSIS — K219 Gastro-esophageal reflux disease without esophagitis: Secondary | ICD-10-CM | POA: Diagnosis not present

## 2017-04-25 DIAGNOSIS — F0391 Unspecified dementia with behavioral disturbance: Secondary | ICD-10-CM | POA: Diagnosis not present

## 2017-04-25 DIAGNOSIS — F330 Major depressive disorder, recurrent, mild: Secondary | ICD-10-CM | POA: Diagnosis not present

## 2017-04-25 DIAGNOSIS — F064 Anxiety disorder due to known physiological condition: Secondary | ICD-10-CM | POA: Diagnosis not present

## 2017-04-29 DIAGNOSIS — M810 Age-related osteoporosis without current pathological fracture: Secondary | ICD-10-CM | POA: Diagnosis not present

## 2017-04-29 DIAGNOSIS — E559 Vitamin D deficiency, unspecified: Secondary | ICD-10-CM | POA: Diagnosis not present

## 2017-04-29 DIAGNOSIS — E785 Hyperlipidemia, unspecified: Secondary | ICD-10-CM | POA: Diagnosis not present

## 2017-04-29 DIAGNOSIS — R296 Repeated falls: Secondary | ICD-10-CM | POA: Diagnosis not present

## 2017-05-23 DIAGNOSIS — F0391 Unspecified dementia with behavioral disturbance: Secondary | ICD-10-CM | POA: Diagnosis not present

## 2017-05-23 DIAGNOSIS — F064 Anxiety disorder due to known physiological condition: Secondary | ICD-10-CM | POA: Diagnosis not present

## 2017-05-23 DIAGNOSIS — F330 Major depressive disorder, recurrent, mild: Secondary | ICD-10-CM | POA: Diagnosis not present

## 2017-05-27 DIAGNOSIS — M810 Age-related osteoporosis without current pathological fracture: Secondary | ICD-10-CM | POA: Diagnosis not present

## 2017-05-27 DIAGNOSIS — F0390 Unspecified dementia without behavioral disturbance: Secondary | ICD-10-CM | POA: Diagnosis not present

## 2017-05-27 DIAGNOSIS — E559 Vitamin D deficiency, unspecified: Secondary | ICD-10-CM | POA: Diagnosis not present

## 2017-05-27 DIAGNOSIS — F0391 Unspecified dementia with behavioral disturbance: Secondary | ICD-10-CM | POA: Diagnosis not present

## 2017-05-27 DIAGNOSIS — F064 Anxiety disorder due to known physiological condition: Secondary | ICD-10-CM | POA: Diagnosis not present

## 2017-05-27 DIAGNOSIS — E785 Hyperlipidemia, unspecified: Secondary | ICD-10-CM | POA: Diagnosis not present

## 2017-05-27 DIAGNOSIS — F419 Anxiety disorder, unspecified: Secondary | ICD-10-CM | POA: Diagnosis not present

## 2017-05-27 DIAGNOSIS — F330 Major depressive disorder, recurrent, mild: Secondary | ICD-10-CM | POA: Diagnosis not present

## 2017-05-27 DIAGNOSIS — K5900 Constipation, unspecified: Secondary | ICD-10-CM | POA: Diagnosis not present

## 2017-05-27 DIAGNOSIS — K219 Gastro-esophageal reflux disease without esophagitis: Secondary | ICD-10-CM | POA: Diagnosis not present

## 2017-06-03 DIAGNOSIS — F0391 Unspecified dementia with behavioral disturbance: Secondary | ICD-10-CM | POA: Diagnosis not present

## 2017-06-03 DIAGNOSIS — F330 Major depressive disorder, recurrent, mild: Secondary | ICD-10-CM | POA: Diagnosis not present

## 2017-06-03 DIAGNOSIS — F064 Anxiety disorder due to known physiological condition: Secondary | ICD-10-CM | POA: Diagnosis not present

## 2017-06-24 DIAGNOSIS — E559 Vitamin D deficiency, unspecified: Secondary | ICD-10-CM | POA: Diagnosis not present

## 2017-06-24 DIAGNOSIS — E785 Hyperlipidemia, unspecified: Secondary | ICD-10-CM | POA: Diagnosis not present

## 2017-06-24 DIAGNOSIS — K219 Gastro-esophageal reflux disease without esophagitis: Secondary | ICD-10-CM | POA: Diagnosis not present

## 2017-06-25 DIAGNOSIS — E559 Vitamin D deficiency, unspecified: Secondary | ICD-10-CM | POA: Diagnosis not present

## 2017-06-25 DIAGNOSIS — F330 Major depressive disorder, recurrent, mild: Secondary | ICD-10-CM | POA: Diagnosis not present

## 2017-06-25 DIAGNOSIS — F064 Anxiety disorder due to known physiological condition: Secondary | ICD-10-CM | POA: Diagnosis not present

## 2017-06-25 DIAGNOSIS — M810 Age-related osteoporosis without current pathological fracture: Secondary | ICD-10-CM | POA: Diagnosis not present

## 2017-06-25 DIAGNOSIS — F419 Anxiety disorder, unspecified: Secondary | ICD-10-CM | POA: Diagnosis not present

## 2017-06-25 DIAGNOSIS — F0391 Unspecified dementia with behavioral disturbance: Secondary | ICD-10-CM | POA: Diagnosis not present

## 2017-06-25 DIAGNOSIS — K5900 Constipation, unspecified: Secondary | ICD-10-CM | POA: Diagnosis not present

## 2017-06-25 DIAGNOSIS — F0390 Unspecified dementia without behavioral disturbance: Secondary | ICD-10-CM | POA: Diagnosis not present

## 2017-06-25 DIAGNOSIS — E785 Hyperlipidemia, unspecified: Secondary | ICD-10-CM | POA: Diagnosis not present

## 2017-06-25 DIAGNOSIS — K219 Gastro-esophageal reflux disease without esophagitis: Secondary | ICD-10-CM | POA: Diagnosis not present

## 2017-07-01 DIAGNOSIS — F064 Anxiety disorder due to known physiological condition: Secondary | ICD-10-CM | POA: Diagnosis not present

## 2017-07-01 DIAGNOSIS — F330 Major depressive disorder, recurrent, mild: Secondary | ICD-10-CM | POA: Diagnosis not present

## 2017-07-01 DIAGNOSIS — F0391 Unspecified dementia with behavioral disturbance: Secondary | ICD-10-CM | POA: Diagnosis not present

## 2017-07-08 DIAGNOSIS — K219 Gastro-esophageal reflux disease without esophagitis: Secondary | ICD-10-CM | POA: Diagnosis not present

## 2017-07-15 DIAGNOSIS — E559 Vitamin D deficiency, unspecified: Secondary | ICD-10-CM | POA: Diagnosis not present

## 2017-07-15 DIAGNOSIS — E785 Hyperlipidemia, unspecified: Secondary | ICD-10-CM | POA: Diagnosis not present

## 2017-07-15 DIAGNOSIS — K219 Gastro-esophageal reflux disease without esophagitis: Secondary | ICD-10-CM | POA: Diagnosis not present

## 2017-07-22 DIAGNOSIS — F419 Anxiety disorder, unspecified: Secondary | ICD-10-CM | POA: Diagnosis not present

## 2017-07-22 DIAGNOSIS — F0391 Unspecified dementia with behavioral disturbance: Secondary | ICD-10-CM | POA: Diagnosis not present

## 2017-07-22 DIAGNOSIS — R296 Repeated falls: Secondary | ICD-10-CM | POA: Diagnosis not present

## 2017-07-27 DIAGNOSIS — E785 Hyperlipidemia, unspecified: Secondary | ICD-10-CM | POA: Diagnosis not present

## 2017-07-27 DIAGNOSIS — F064 Anxiety disorder due to known physiological condition: Secondary | ICD-10-CM | POA: Diagnosis not present

## 2017-07-27 DIAGNOSIS — K219 Gastro-esophageal reflux disease without esophagitis: Secondary | ICD-10-CM | POA: Diagnosis not present

## 2017-07-27 DIAGNOSIS — M810 Age-related osteoporosis without current pathological fracture: Secondary | ICD-10-CM | POA: Diagnosis not present

## 2017-07-27 DIAGNOSIS — K5900 Constipation, unspecified: Secondary | ICD-10-CM | POA: Diagnosis not present

## 2017-07-27 DIAGNOSIS — F419 Anxiety disorder, unspecified: Secondary | ICD-10-CM | POA: Diagnosis not present

## 2017-07-27 DIAGNOSIS — E559 Vitamin D deficiency, unspecified: Secondary | ICD-10-CM | POA: Diagnosis not present

## 2017-07-27 DIAGNOSIS — F0390 Unspecified dementia without behavioral disturbance: Secondary | ICD-10-CM | POA: Diagnosis not present

## 2017-07-27 DIAGNOSIS — F330 Major depressive disorder, recurrent, mild: Secondary | ICD-10-CM | POA: Diagnosis not present

## 2017-07-27 DIAGNOSIS — F0391 Unspecified dementia with behavioral disturbance: Secondary | ICD-10-CM | POA: Diagnosis not present

## 2017-08-06 DIAGNOSIS — F0391 Unspecified dementia with behavioral disturbance: Secondary | ICD-10-CM | POA: Diagnosis not present

## 2017-08-06 DIAGNOSIS — F330 Major depressive disorder, recurrent, mild: Secondary | ICD-10-CM | POA: Diagnosis not present

## 2017-08-06 DIAGNOSIS — F064 Anxiety disorder due to known physiological condition: Secondary | ICD-10-CM | POA: Diagnosis not present

## 2017-08-19 DIAGNOSIS — E785 Hyperlipidemia, unspecified: Secondary | ICD-10-CM | POA: Diagnosis not present

## 2017-08-19 DIAGNOSIS — K219 Gastro-esophageal reflux disease without esophagitis: Secondary | ICD-10-CM | POA: Diagnosis not present

## 2017-08-19 DIAGNOSIS — E559 Vitamin D deficiency, unspecified: Secondary | ICD-10-CM | POA: Diagnosis not present

## 2017-08-20 DIAGNOSIS — F0391 Unspecified dementia with behavioral disturbance: Secondary | ICD-10-CM | POA: Diagnosis not present

## 2017-08-20 DIAGNOSIS — F330 Major depressive disorder, recurrent, mild: Secondary | ICD-10-CM | POA: Diagnosis not present

## 2017-08-20 DIAGNOSIS — F064 Anxiety disorder due to known physiological condition: Secondary | ICD-10-CM | POA: Diagnosis not present

## 2017-08-27 DIAGNOSIS — K219 Gastro-esophageal reflux disease without esophagitis: Secondary | ICD-10-CM | POA: Diagnosis not present

## 2017-08-27 DIAGNOSIS — F419 Anxiety disorder, unspecified: Secondary | ICD-10-CM | POA: Diagnosis not present

## 2017-08-27 DIAGNOSIS — M810 Age-related osteoporosis without current pathological fracture: Secondary | ICD-10-CM | POA: Diagnosis not present

## 2017-08-27 DIAGNOSIS — E785 Hyperlipidemia, unspecified: Secondary | ICD-10-CM | POA: Diagnosis not present

## 2017-08-27 DIAGNOSIS — F0391 Unspecified dementia with behavioral disturbance: Secondary | ICD-10-CM | POA: Diagnosis not present

## 2017-08-27 DIAGNOSIS — F330 Major depressive disorder, recurrent, mild: Secondary | ICD-10-CM | POA: Diagnosis not present

## 2017-08-27 DIAGNOSIS — K5900 Constipation, unspecified: Secondary | ICD-10-CM | POA: Diagnosis not present

## 2017-08-27 DIAGNOSIS — E559 Vitamin D deficiency, unspecified: Secondary | ICD-10-CM | POA: Diagnosis not present

## 2017-08-27 DIAGNOSIS — F0390 Unspecified dementia without behavioral disturbance: Secondary | ICD-10-CM | POA: Diagnosis not present

## 2017-08-27 DIAGNOSIS — F064 Anxiety disorder due to known physiological condition: Secondary | ICD-10-CM | POA: Diagnosis not present

## 2017-09-16 DIAGNOSIS — R296 Repeated falls: Secondary | ICD-10-CM | POA: Diagnosis not present

## 2017-09-16 DIAGNOSIS — K5900 Constipation, unspecified: Secondary | ICD-10-CM | POA: Diagnosis not present

## 2017-09-16 DIAGNOSIS — F330 Major depressive disorder, recurrent, mild: Secondary | ICD-10-CM | POA: Diagnosis not present

## 2017-09-17 DIAGNOSIS — F0391 Unspecified dementia with behavioral disturbance: Secondary | ICD-10-CM | POA: Diagnosis not present

## 2017-09-17 DIAGNOSIS — F330 Major depressive disorder, recurrent, mild: Secondary | ICD-10-CM | POA: Diagnosis not present

## 2017-09-17 DIAGNOSIS — F064 Anxiety disorder due to known physiological condition: Secondary | ICD-10-CM | POA: Diagnosis not present

## 2017-09-26 DIAGNOSIS — F0390 Unspecified dementia without behavioral disturbance: Secondary | ICD-10-CM | POA: Diagnosis not present

## 2017-09-26 DIAGNOSIS — K219 Gastro-esophageal reflux disease without esophagitis: Secondary | ICD-10-CM | POA: Diagnosis not present

## 2017-09-26 DIAGNOSIS — F064 Anxiety disorder due to known physiological condition: Secondary | ICD-10-CM | POA: Diagnosis not present

## 2017-09-26 DIAGNOSIS — F330 Major depressive disorder, recurrent, mild: Secondary | ICD-10-CM | POA: Diagnosis not present

## 2017-09-26 DIAGNOSIS — F419 Anxiety disorder, unspecified: Secondary | ICD-10-CM | POA: Diagnosis not present

## 2017-09-26 DIAGNOSIS — F0391 Unspecified dementia with behavioral disturbance: Secondary | ICD-10-CM | POA: Diagnosis not present

## 2017-09-26 DIAGNOSIS — E559 Vitamin D deficiency, unspecified: Secondary | ICD-10-CM | POA: Diagnosis not present

## 2017-09-26 DIAGNOSIS — K5900 Constipation, unspecified: Secondary | ICD-10-CM | POA: Diagnosis not present

## 2017-09-26 DIAGNOSIS — E785 Hyperlipidemia, unspecified: Secondary | ICD-10-CM | POA: Diagnosis not present

## 2017-09-26 DIAGNOSIS — M810 Age-related osteoporosis without current pathological fracture: Secondary | ICD-10-CM | POA: Diagnosis not present

## 2017-10-01 DIAGNOSIS — R4182 Altered mental status, unspecified: Secondary | ICD-10-CM | POA: Diagnosis not present

## 2017-10-07 DIAGNOSIS — B029 Zoster without complications: Secondary | ICD-10-CM | POA: Diagnosis not present

## 2017-10-08 DIAGNOSIS — F0281 Dementia in other diseases classified elsewhere with behavioral disturbance: Secondary | ICD-10-CM | POA: Diagnosis not present

## 2017-10-08 DIAGNOSIS — F064 Anxiety disorder due to known physiological condition: Secondary | ICD-10-CM | POA: Diagnosis not present

## 2017-10-08 DIAGNOSIS — F330 Major depressive disorder, recurrent, mild: Secondary | ICD-10-CM | POA: Diagnosis not present

## 2017-10-08 DIAGNOSIS — G301 Alzheimer's disease with late onset: Secondary | ICD-10-CM | POA: Diagnosis not present

## 2017-10-14 DIAGNOSIS — E559 Vitamin D deficiency, unspecified: Secondary | ICD-10-CM | POA: Diagnosis not present

## 2017-10-14 DIAGNOSIS — K219 Gastro-esophageal reflux disease without esophagitis: Secondary | ICD-10-CM | POA: Diagnosis not present

## 2017-10-14 DIAGNOSIS — B029 Zoster without complications: Secondary | ICD-10-CM | POA: Diagnosis not present

## 2017-10-14 DIAGNOSIS — R4182 Altered mental status, unspecified: Secondary | ICD-10-CM | POA: Diagnosis not present

## 2017-10-15 DIAGNOSIS — F330 Major depressive disorder, recurrent, mild: Secondary | ICD-10-CM | POA: Diagnosis not present

## 2017-10-15 DIAGNOSIS — F064 Anxiety disorder due to known physiological condition: Secondary | ICD-10-CM | POA: Diagnosis not present

## 2017-10-15 DIAGNOSIS — F0281 Dementia in other diseases classified elsewhere with behavioral disturbance: Secondary | ICD-10-CM | POA: Diagnosis not present

## 2017-10-15 DIAGNOSIS — G301 Alzheimer's disease with late onset: Secondary | ICD-10-CM | POA: Diagnosis not present

## 2017-10-23 DIAGNOSIS — E785 Hyperlipidemia, unspecified: Secondary | ICD-10-CM | POA: Diagnosis not present

## 2017-10-23 DIAGNOSIS — F419 Anxiety disorder, unspecified: Secondary | ICD-10-CM | POA: Diagnosis not present

## 2017-10-23 DIAGNOSIS — F0390 Unspecified dementia without behavioral disturbance: Secondary | ICD-10-CM | POA: Diagnosis not present

## 2017-10-23 DIAGNOSIS — F0391 Unspecified dementia with behavioral disturbance: Secondary | ICD-10-CM | POA: Diagnosis not present

## 2017-10-23 DIAGNOSIS — E559 Vitamin D deficiency, unspecified: Secondary | ICD-10-CM | POA: Diagnosis not present

## 2017-10-23 DIAGNOSIS — K5900 Constipation, unspecified: Secondary | ICD-10-CM | POA: Diagnosis not present

## 2017-10-23 DIAGNOSIS — F064 Anxiety disorder due to known physiological condition: Secondary | ICD-10-CM | POA: Diagnosis not present

## 2017-10-23 DIAGNOSIS — F330 Major depressive disorder, recurrent, mild: Secondary | ICD-10-CM | POA: Diagnosis not present

## 2017-10-23 DIAGNOSIS — K219 Gastro-esophageal reflux disease without esophagitis: Secondary | ICD-10-CM | POA: Diagnosis not present

## 2017-10-23 DIAGNOSIS — M810 Age-related osteoporosis without current pathological fracture: Secondary | ICD-10-CM | POA: Diagnosis not present

## 2017-11-05 DIAGNOSIS — F064 Anxiety disorder due to known physiological condition: Secondary | ICD-10-CM | POA: Diagnosis not present

## 2017-11-05 DIAGNOSIS — F330 Major depressive disorder, recurrent, mild: Secondary | ICD-10-CM | POA: Diagnosis not present

## 2017-11-05 DIAGNOSIS — F0281 Dementia in other diseases classified elsewhere with behavioral disturbance: Secondary | ICD-10-CM | POA: Diagnosis not present

## 2017-11-05 DIAGNOSIS — G301 Alzheimer's disease with late onset: Secondary | ICD-10-CM | POA: Diagnosis not present

## 2017-11-07 ENCOUNTER — Emergency Department (HOSPITAL_COMMUNITY): Payer: Medicare Other

## 2017-11-07 ENCOUNTER — Emergency Department (HOSPITAL_COMMUNITY)
Admission: EM | Admit: 2017-11-07 | Discharge: 2017-11-07 | Disposition: A | Discharge status: Home / Self Care | Payer: Medicare Other | Attending: Emergency Medicine | Admitting: Emergency Medicine

## 2017-11-07 DIAGNOSIS — Z853 Personal history of malignant neoplasm of breast: Secondary | ICD-10-CM | POA: Diagnosis not present

## 2017-11-07 DIAGNOSIS — B0229 Other postherpetic nervous system involvement: Secondary | ICD-10-CM | POA: Insufficient documentation

## 2017-11-07 DIAGNOSIS — R4182 Altered mental status, unspecified: Secondary | ICD-10-CM | POA: Diagnosis not present

## 2017-11-07 DIAGNOSIS — Z950 Presence of cardiac pacemaker: Secondary | ICD-10-CM | POA: Insufficient documentation

## 2017-11-07 DIAGNOSIS — Z87891 Personal history of nicotine dependence: Secondary | ICD-10-CM | POA: Diagnosis not present

## 2017-11-07 DIAGNOSIS — R079 Chest pain, unspecified: Secondary | ICD-10-CM | POA: Diagnosis not present

## 2017-11-07 DIAGNOSIS — Z79899 Other long term (current) drug therapy: Secondary | ICD-10-CM | POA: Diagnosis not present

## 2017-11-07 DIAGNOSIS — R0789 Other chest pain: Secondary | ICD-10-CM

## 2017-11-07 LAB — BASIC METABOLIC PANEL
ANION GAP: 6 (ref 5–15)
BUN: 17 mg/dL (ref 6–20)
CALCIUM: 9.1 mg/dL (ref 8.9–10.3)
CO2: 26 mmol/L (ref 22–32)
Chloride: 102 mmol/L (ref 101–111)
Creatinine, Ser: 0.65 mg/dL (ref 0.44–1.00)
GFR calc non Af Amer: >60 mL/min (ref >60)
Glucose, Bld: 114 mg/dL — ABNORMAL HIGH (ref 65–99)
POTASSIUM: 3.9 mmol/L (ref 3.5–5.1)
SODIUM: 134 mmol/L — AB (ref 135–145)

## 2017-11-07 LAB — CBC
HEMATOCRIT: 40.9 % (ref 36.0–46.0)
HEMOGLOBIN: 13.5 g/dL (ref 12.0–15.0)
MCH: 28.8 pg (ref 26.0–34.0)
MCHC: 33 g/dL (ref 30.0–36.0)
MCV: 87.4 fL (ref 78.0–100.0)
Platelets: 147 10*3/uL — ABNORMAL LOW (ref 150–400)
RBC: 4.68 MIL/uL (ref 3.87–5.11)
RDW: 14.8 % (ref 11.5–15.5)
WBC: 6.9 10*3/uL (ref 4.0–10.5)

## 2017-11-07 LAB — I-STAT TROPONIN, ED: TROPONIN I, POC: 0.01 ng/mL (ref 0.00–0.08)

## 2017-11-07 MED ORDER — ACETAMINOPHEN 325 MG PO TABS
650.0000 mg | ORAL_TABLET | Freq: Once | ORAL | Status: AC
  Administered 2017-11-07: 650 mg via ORAL
  Filled 2017-11-07: qty 2

## 2017-11-07 NOTE — ED Provider Notes (Signed)
[facility]ERG[facility] DEPARTMENT Provider Note   CSN: [id number] Arrival date & time: 11/07/17  1143     History   Chief Complaint Chief Complaint  Patient presents with  . Chest Pain    HPI [name] is a 82 y.o. female.  Patient is an 82 year old female with a history of sinus node dysfunction status post pacemaker, dementia who lives in a skilled nursing facility, right-sided breast cancer, subarachnoid hemorrhage due to trauma who is presenting today by EMS for left-sided chest pain.  Unclear how reliable patient's story is but states she woke up this morning with pain in the left side of her chest.  She denied feeling short of breath or nauseated.  Patient states the pain is better now.  No known cardiac disease or stents in the past.  Facility states that patient has been getting over shingles over the left side of the chest and back.  The EMS upon arrival states vital signs were stable and patient did not require any medications.  Nursing facility denies any URI symptoms such as cough, fever or congestion.   The history is provided by the patient, the EMS personnel and the nursing home. The history is limited by the absence of a caregiver (pt has dementia and not good historian).  Chest Pain   This is a new problem. The current episode started 3 to 5 hours ago. The pain is present in the lateral region (left lateral). Pertinent negatives include no abdominal pain, no cough, no fever, no lower extremity edema and no shortness of breath. She has tried nothing for the symptoms. The treatment provided significant relief.    Past Medical History:  Diagnosis Date  . Breast cancer ([place]) 1996   right side  . Dementia    forgetful  . FH: mastectomy    right side   . Hyponatremia   . Lichen planus   . Osteoporosis   . Pacemaker   . Sinus node dysfunction (H[facility])    with syncope; status post implantation of a [employer]. [employer] Medical Zephyr XL model 5826  dual-chamber pacemaker  November 27, 2008.  . Subarachnoid hemorrhage following injury ([place])    right frontal, following traumatic brain injury    Patient Active Problem List   Diagnosis Date Noted  . GERD (gastroesophageal reflux disease) 10/07/2016  . OCD (obsessive compulsive disorder) 09/18/2016  . Acute bronchitis 09/08/2016  . Constipation 08/13/2016  . Memory change 04/06/2016  . Osteoporosis   . Pacemaker   . Lichen planus   . Atrial fibrillation 12/12/2013  . Sinus node dysfunction ([place]) 05/02/2011  . Hyponatremia 05/10/2010  . PERSONAL HISTORY OF TRAUMATIC BRAIN INJURY 05/10/2010    Past Surgical History:  Procedure Laterality Date  . ABDOMINAL HYSTERECTOMY    . APPENDECTOMY    . BREAST ENHANCEMENT SURGERY Right 1997   By [place] [facility]  . CESAREAN SECTION  1966  . MASTECTOMY Right 1996   By [facility]  . PACEMAKER INSERTION  11/27/08   by Greggory Brandy ([facility])  . TONSILLECTOMY      OB History    No data available       Home Medications    Prior to Admission medications   Medication Sig Start Date End Date Taking? Authorizing Provider  ALPRAZolam (XANAX) 0.25 MG tablet Take 0.25 mg by mouth 2 (two) times daily.    [provider]  amoxicillin-clavulanate (AUGMENTIN) 875-125 MG tablet Take 1 tablet by mouth 2 (two) times daily.  [provider]  benzocaine-menthol (CHLORASEPTIC) 6-10 MG lozenge Take 1 lozenge by mouth as needed for sore throat (As directed).     [provider]  cholecalciferol (VITAMIN D) 1000 UNITS tablet Take 2,000 Units by mouth. Take 2 daily    [provider]  donepezil (ARICEPT) 10 MG tablet Take 10 mg by mouth at bedtime.    [provider]  guaiFENesin (MUCINEX) 600 MG 12 hr tablet Take 600 mg by mouth 2 (two) times daily.    [provider]  hydrocortisone 1 % ointment Apply 1 application topically 2 (two) times daily as needed for itching.    [provider]  loratadine (CLARITIN) 10  MG tablet Take 10 mg by mouth daily.    [provider]  Multiple Vitamin (MULITIVITAMIN WITH MINERALS) TABS Take 1 tablet by mouth daily.    [provider]  nystatin (MYCOSTATIN/NYSTOP) powder Apply topically as directed. Apply power to urogenital area twice daily     [provider]  Omega-3 Fatty Acids (FISH OIL PO) Take 1 tablet by mouth daily.     [provider]  omeprazole (PRILOSEC) 20 MG capsule Take 20 mg by mouth daily.    [provider]  risperiDONE (RISPERDAL) 0.25 MG tablet Take 0.25 mg by mouth 2 (two) times daily.    [provider]  saccharomyces boulardii (FLORASTOR) 250 MG capsule Take 250 mg by mouth 2 (two) times daily.    [provider]  senna (SENOKOT) 8.6 MG tablet Take 1 tablet by mouth at bedtime.     [provider]    Family History Family History  Problem Relation Age of Onset  . Heart attack Mother 82  . Stroke Father 28  . Alcohol abuse Father 67  . Alcoholism Brother 65    Social History Social History   Tobacco Use  . Smoking status: Former Smoker    Last attempt to quit: 03/25/1996    Years since quitting: 21.6  . Smokeless tobacco: Never Used  Substance Use Topics  . Alcohol use: Yes    Alcohol/week: 4.2 oz    Types: 7 Glasses of wine per week  . Drug use: Yes    Frequency: 1.0 times per week    Comment: 1 glass of wine daily     Allergies   Actonel [risedronate sodium]; Aspirin; Caffeine; Calcium; Codeine; Evista [raloxifene]; Fosamax [alendronate sodium]; Morphine; Penicillins; Prednisone; Propoxyphene hcl; Propoxyphene n-acetaminophen; and Sulfa antibiotics   Review of Systems Review of Systems  Constitutional: Negative for fever.  Respiratory: Negative for cough and shortness of breath.   Cardiovascular: Positive for chest pain.  Gastrointestinal: Negative for abdominal pain.  All other systems reviewed and are negative.    Physical Exam Updated Vital  Signs BP (!) 112/38   Pulse 88   Resp (!) 23   SpO2 95%   Physical Exam  Constitutional: She appears well-developed and well-nourished. No distress.  HENT:  Head: Normocephalic and atraumatic.  Eyes: EOM are normal. Pupils are equal, round, and reactive to light.  Cardiovascular: Normal rate, regular rhythm, normal heart sounds and intact distal pulses. Exam reveals no friction rub.  No murmur heard. Pulmonary/Chest: Effort normal and breath sounds normal. She has no wheezes. She has no rales.        Abdominal: Soft. Bowel sounds are normal. She exhibits no distension. There is no tenderness. There is no rebound and no guarding.  Musculoskeletal: Normal range of motion. She exhibits no edema  or tenderness.  No edema  Neurological: She is alert. She has normal strength. No sensory deficit.  Oriented to person  Skin: Skin is warm and dry. No rash noted.  Psychiatric: She has a normal mood and affect. Her behavior is normal.  Nursing note and vitals reviewed.    ED Treatments / Results  Labs (all labs ordered are listed, but only abnormal results are displayed) Labs Reviewed  BASIC METABOLIC PANEL - Abnormal; Notable for the following components:      Result Value   Sodium 134 (*)    Glucose, Bld 114 (*)    All other components within normal limits  CBC - Abnormal; Notable for the following components:   Platelets 147 (*)    All other components within normal limits  I-STAT TROPONIN, ED    EKG  EKG Interpretation  Date/Time:  Saturday November 07 2017 11:47:00 EST Ventricular Rate:  93 PR Interval:    QRS Duration: 84 QT Interval:  377 QTC Calculation: 433 R Axis:   74 Text Interpretation:  Sinus rhythm Atrial premature complexes Borderline low voltage, extremity leads Minimal ST depression, lateral leads Baseline wander Confirmed by Blanchie Dessert [phone]) on 11/07/2017 12:01:41 PM       Radiology Dg Chest 2 View  Result Date: 11/07/2017 CLINICAL DATA:   Left-sided chest pain, left-sided back pain, flank pain today. Dementia. EXAM: CHEST  2 VIEW COMPARISON:  11/28/2008 FINDINGS: The heart remains moderately enlarged. Vascularity is within normal limits. Double lead left subclavian pacemaker device and leads are stable and intact. There are no Kerley B lines. Opacity at the left base is likely related to cardiomegaly. No pneumothorax or pleural effusion. IMPRESSION: Cardiomegaly without decompensation. Electronically Signed   By: Marybelle Killings M.D.   On: 11/07/2017 12:25    Procedures Procedures (including critical care time)  Medications Ordered in ED Medications  acetaminophen (TYLENOL) tablet 650 mg (not administered)     Initial Impression / Assessment and Plan / ED Course  I have reviewed the triage vital signs and the nursing notes.  Pertinent labs & imaging results that were available during my care of the patient were reviewed by me and considered in my medical decision making (see chart for details).     Patient presented from nursing facility today complaining of left-sided chest and back pain.  Patient has dementia and is a poor historian.  The facility reported no URI symptoms, symptoms of fluid overload and patient denies any abdominal pain.  Patient does have a healing zoster rash over the left breast and back which could be the cause of her pain if she has posterior herpetic neuralgia.  Patient's lung sounds are clear and heart is regular rate and rhythm.  On EKG she has a sinus rhythm with a wandering baseline but no specific signs concerning for MI.  Vital signs are within normal limits.  Patient has no evidence of fluid overload on exam. Patient does have a history of hyponatremia and electrolyte abnormalities so we will check a CBC, BMP and troponin.  Chest x-ray is pending.  Pt given tylenol. Given patient symptoms started when she woke up this morning and are now resolved.  It seems to be more musculoskeletal in nature.   Patient starts to become very agitated with being in the emergency room.  She is climbing out of bed she is able to walk without difficulty.  She does not appear to be in any distress with activity.  We will send patient  back to her facility.  Final Clinical Impressions(s) / ED Diagnoses   Final diagnoses:  Chest wall pain  Post herpetic neuralgia    ED Discharge Orders    None       Blanchie Dessert, MD 11/07/17 1341

## 2017-11-07 NOTE — ED Notes (Signed)
Pt refusing to stay in bed and be monitored by vital signs. Window blinds opened for ED staff to keep an eye on pt.

## 2017-11-07 NOTE — ED Notes (Signed)
P[place]y bed alarm applied.

## 2017-11-07 NOTE — ED Notes (Signed)
Patient transported to X-ray 

## 2017-11-07 NOTE — ED Triage Notes (Signed)
Pt BIB EMS from [facility] for CP. Per EMS, pt has been c/o left sided CP and left side back/flank pain since this AM. Pt baseline dementia, staff denies pt having any recent falls. Pt denies SOB at this time. Pt alert to self, disoriented to place, time, and situation; remembers she woke up this AM with CP.  Resp e/u; NAD at this time.   Pt has old shingles infection that's healing; EDP aware, precautions not indicated at this time.

## 2017-11-11 DIAGNOSIS — E559 Vitamin D deficiency, unspecified: Secondary | ICD-10-CM | POA: Diagnosis not present

## 2017-11-11 DIAGNOSIS — R4182 Altered mental status, unspecified: Secondary | ICD-10-CM | POA: Diagnosis not present

## 2017-11-11 DIAGNOSIS — K219 Gastro-esophageal reflux disease without esophagitis: Secondary | ICD-10-CM | POA: Diagnosis not present

## 2017-11-11 DIAGNOSIS — B029 Zoster without complications: Secondary | ICD-10-CM | POA: Diagnosis not present

## 2017-11-11 DIAGNOSIS — E785 Hyperlipidemia, unspecified: Secondary | ICD-10-CM | POA: Diagnosis not present

## 2017-11-11 DIAGNOSIS — B0229 Other postherpetic nervous system involvement: Secondary | ICD-10-CM | POA: Diagnosis not present

## 2017-11-11 DIAGNOSIS — M810 Age-related osteoporosis without current pathological fracture: Secondary | ICD-10-CM | POA: Diagnosis not present

## 2017-11-24 DIAGNOSIS — F330 Major depressive disorder, recurrent, mild: Secondary | ICD-10-CM | POA: Diagnosis not present

## 2017-11-24 DIAGNOSIS — F064 Anxiety disorder due to known physiological condition: Secondary | ICD-10-CM | POA: Diagnosis not present

## 2017-11-24 DIAGNOSIS — G301 Alzheimer's disease with late onset: Secondary | ICD-10-CM | POA: Diagnosis not present

## 2017-11-24 DIAGNOSIS — K219 Gastro-esophageal reflux disease without esophagitis: Secondary | ICD-10-CM | POA: Diagnosis not present

## 2017-11-24 DIAGNOSIS — F0391 Unspecified dementia with behavioral disturbance: Secondary | ICD-10-CM | POA: Diagnosis not present

## 2017-11-24 DIAGNOSIS — F0390 Unspecified dementia without behavioral disturbance: Secondary | ICD-10-CM | POA: Diagnosis not present

## 2017-11-24 DIAGNOSIS — M810 Age-related osteoporosis without current pathological fracture: Secondary | ICD-10-CM | POA: Diagnosis not present

## 2017-11-24 DIAGNOSIS — F419 Anxiety disorder, unspecified: Secondary | ICD-10-CM | POA: Diagnosis not present

## 2017-11-24 DIAGNOSIS — E785 Hyperlipidemia, unspecified: Secondary | ICD-10-CM | POA: Diagnosis not present

## 2017-11-24 DIAGNOSIS — K5900 Constipation, unspecified: Secondary | ICD-10-CM | POA: Diagnosis not present

## 2017-11-24 DIAGNOSIS — E559 Vitamin D deficiency, unspecified: Secondary | ICD-10-CM | POA: Diagnosis not present

## 2017-11-26 DIAGNOSIS — F330 Major depressive disorder, recurrent, mild: Secondary | ICD-10-CM | POA: Diagnosis not present

## 2017-11-26 DIAGNOSIS — G301 Alzheimer's disease with late onset: Secondary | ICD-10-CM | POA: Diagnosis not present

## 2017-11-26 DIAGNOSIS — F064 Anxiety disorder due to known physiological condition: Secondary | ICD-10-CM | POA: Diagnosis not present

## 2017-11-26 DIAGNOSIS — F0281 Dementia in other diseases classified elsewhere with behavioral disturbance: Secondary | ICD-10-CM | POA: Diagnosis not present

## 2017-12-09 DIAGNOSIS — R4182 Altered mental status, unspecified: Secondary | ICD-10-CM | POA: Diagnosis not present

## 2017-12-09 DIAGNOSIS — K219 Gastro-esophageal reflux disease without esophagitis: Secondary | ICD-10-CM | POA: Diagnosis not present

## 2017-12-09 DIAGNOSIS — E559 Vitamin D deficiency, unspecified: Secondary | ICD-10-CM | POA: Diagnosis not present

## 2017-12-09 DIAGNOSIS — B029 Zoster without complications: Secondary | ICD-10-CM | POA: Diagnosis not present

## 2017-12-09 DIAGNOSIS — E785 Hyperlipidemia, unspecified: Secondary | ICD-10-CM | POA: Diagnosis not present

## 2017-12-09 DIAGNOSIS — B0229 Other postherpetic nervous system involvement: Secondary | ICD-10-CM | POA: Diagnosis not present

## 2017-12-09 DIAGNOSIS — M810 Age-related osteoporosis without current pathological fracture: Secondary | ICD-10-CM | POA: Diagnosis not present

## 2017-12-15 ENCOUNTER — Other Ambulatory Visit: Payer: Self-pay

## 2017-12-15 ENCOUNTER — Emergency Department (HOSPITAL_COMMUNITY): Payer: Medicare Other

## 2017-12-15 ENCOUNTER — Encounter (HOSPITAL_COMMUNITY): Payer: Self-pay

## 2017-12-15 ENCOUNTER — Emergency Department (HOSPITAL_COMMUNITY)
Admission: EM | Admit: 2017-12-15 | Discharge: 2017-12-15 | Disposition: A | Discharge status: Home / Self Care | Payer: Medicare Other | Attending: Emergency Medicine | Admitting: Emergency Medicine

## 2017-12-15 DIAGNOSIS — M25519 Pain in unspecified shoulder: Secondary | ICD-10-CM | POA: Diagnosis not present

## 2017-12-15 DIAGNOSIS — Z853 Personal history of malignant neoplasm of breast: Secondary | ICD-10-CM | POA: Insufficient documentation

## 2017-12-15 DIAGNOSIS — Z87891 Personal history of nicotine dependence: Secondary | ICD-10-CM | POA: Insufficient documentation

## 2017-12-15 DIAGNOSIS — R079 Chest pain, unspecified: Secondary | ICD-10-CM | POA: Diagnosis not present

## 2017-12-15 DIAGNOSIS — R21 Rash and other nonspecific skin eruption: Secondary | ICD-10-CM | POA: Diagnosis not present

## 2017-12-15 DIAGNOSIS — R0789 Other chest pain: Secondary | ICD-10-CM | POA: Diagnosis not present

## 2017-12-15 DIAGNOSIS — Z79899 Other long term (current) drug therapy: Secondary | ICD-10-CM | POA: Insufficient documentation

## 2017-12-15 DIAGNOSIS — Z950 Presence of cardiac pacemaker: Secondary | ICD-10-CM | POA: Insufficient documentation

## 2017-12-15 DIAGNOSIS — B999 Unspecified infectious disease: Secondary | ICD-10-CM | POA: Diagnosis not present

## 2017-12-15 DIAGNOSIS — F0390 Unspecified dementia without behavioral disturbance: Secondary | ICD-10-CM | POA: Diagnosis not present

## 2017-12-15 DIAGNOSIS — M549 Dorsalgia, unspecified: Secondary | ICD-10-CM | POA: Diagnosis not present

## 2017-12-15 LAB — BASIC METABOLIC PANEL
Anion gap: 8 (ref 5–15)
BUN: 17 mg/dL (ref 6–20)
CHLORIDE: 102 mmol/L (ref 101–111)
CO2: 22 mmol/L (ref 22–32)
Calcium: 9.4 mg/dL (ref 8.9–10.3)
Creatinine, Ser: 0.58 mg/dL (ref 0.44–1.00)
GFR calc Af Amer: >60 mL/min (ref >60)
GFR calc non Af Amer: >60 mL/min (ref >60)
Glucose, Bld: 103 mg/dL — ABNORMAL HIGH (ref 65–99)
POTASSIUM: 4.6 mmol/L (ref 3.5–5.1)
SODIUM: 132 mmol/L — AB (ref 135–145)

## 2017-12-15 LAB — I-STAT TROPONIN, ED
TROPONIN I, POC: 0.01 ng/mL (ref 0.00–0.08)
Troponin i, poc: 0 ng/mL (ref 0.00–0.08)

## 2017-12-15 LAB — BRAIN NATRIURETIC PEPTIDE: B NATRIURETIC PEPTIDE 5: 100.6 pg/mL — AB (ref 0.0–100.0)

## 2017-12-15 NOTE — ED Triage Notes (Signed)
Pt arrives to ED from [facility] memory care with complaints of right sided shoulder pain that has progressed "for a while". EMS reports pt is reportedly normally much more mobile and active, lower extremity swelling is new as well. Pt has pacemaker. Pt currently stating no pain.Pt placed in position of comfort with bed locked and lowered, call bell in reach.

## 2017-12-15 NOTE — ED Provider Notes (Signed)
[facility] EMERGENCY DEPARTMENT Provider Note   CSN: [id number] Arrival date & time: 12/15/17  1429     History   Chief Complaint Chief Complaint  Patient presents with  . Shoulder Pain  . Chest Pain    HPI JORDANA DUGUE is a 82 y.o. female.  HPI Level 5 caveat due to dementia.  82 year old female who presents with chest pain. History of dementia, right sided breast cancer s/p mastectomy, sinus node dysfunction s/p permanent St. Jude pacemaker.  Patient is a resident of [facility] memory care.  She is unable to provide any history.  Per EMS, patient has been complaining of chest pain and shoulder pain intermittently for "a while."  I spoke with  staff from her facility who states that today she appeared to have more pain which was the reason why they sent her in.  The nursing facility physician has been treating her for chest pain and shoulder pain related to post shingles neuralgia, which was diagnosed 1 month ago in the emergency department.  She has been having increasing pain, and she has been written for Tylenol as well as Neurontin for her symptoms.  They deny any recent illnesses including fever, cough, difficulty breathing.  Her mental status has been baseline per facility.  She has not had any recent falls or trauma.  Past Medical History:  Diagnosis Date  . Breast cancer ([place]) 1996   right side  . Dementia    forgetful  . FH: mastectomy    right side   . Hyponatremia   . Lichen planus   . Osteoporosis   . Pacemaker   . Sinus node dysfunction (HCC)    with syncope; status post implantation of a St. Jude Medical Zephyr XL model 5826 dual-chamber pacemaker  November 27, 2008.  . Subarachnoid hemorrhage following injury ([place])    right frontal, following traumatic brain injury    Patient Active Problem List   Diagnosis Date Noted  . GERD (gastroesophageal reflux disease) 10/07/2016  . OCD (obsessive compulsive disorder) 09/18/2016  .  Acute bronchitis 09/08/2016  . Constipation 08/13/2016  . Memory change 04/06/2016  . Osteoporosis   . Pacemaker   . Lichen planus   . Atrial fibrillation 12/12/2013  . Sinus node dysfunction ([place]) 05/02/2011  . Hyponatremia 05/10/2010  . PERSONAL HISTORY OF TRAUMATIC BRAIN INJURY 05/10/2010    Past Surgical History:  Procedure Laterality Date  . ABDOMINAL HYSTERECTOMY    . APPENDECTOMY    . BREAST ENHANCEMENT SURGERY Right 1997   By  [facility]  . CESAREAN SECTION  1966  . MASTECTOMY Right 1996   By [facility]  . PACEMAKER INSERTION  11/27/08   by Greggory Brandy ([facility])  . TONSILLECTOMY      OB History    No data available       Home Medications    Prior to Admission medications   Medication Sig Start Date End Date Taking? Authorizing Provider  acetaminophen (TYLENOL) 500 MG tablet Take 500 mg by mouth 2 (two) times daily.   Yes [provider]  acetaminophen (TYLENOL) 500 MG tablet Take 500 mg by mouth every 4 (four) hours as needed.   Yes [provider]  ALPRAZolam (XANAX) 0.25 MG tablet Take 0.25 mg by mouth 2 (two) times daily.   Yes [provider]  cholecalciferol (VITAMIN D) 1000 UNITS tablet Take 1,000 Units by mouth. Take 2 daily   Yes [provider]  gabapentin (NEURONTIN) 100  MG capsule Take 200 mg by mouth 3 (three) times daily.  11/09/17  Yes [provider]  LORazepam (ATIVAN) 0.5 MG tablet Take 0.25 mg 2 (two) times daily by mouth.   Yes [provider]  Multiple Vitamin (MULITIVITAMIN WITH MINERALS) TABS Take 1 tablet by mouth daily. [employer]  tabs   Yes [provider]  Omega-3 Fatty Acids (FISH OIL PO) Take 1 tablet by mouth daily.    Yes [provider]  ranitidine (ZANTAC) 75 MG tablet Take 75 mg daily by mouth.   Yes [provider]  risperiDONE (RISPERDAL) 0.25 MG tablet Take 0.25 mg at bedtime by mouth.    Yes [provider]  risperiDONE (RISPERDAL) 0.5 MG tablet  Take 0.5 mg by mouth daily.   Yes [provider]  senna (SENOKOT) 8.6 MG tablet Take 1 tablet by mouth at bedtime.    Yes [provider]  sertraline (ZOLOFT) 50 MG tablet Take 75 mg daily by mouth.   Yes [provider]  loratadine (CLARITIN) 10 MG tablet Take 10 mg by mouth daily.    [provider]  omeprazole (PRILOSEC) 20 MG capsule Take 20 mg by mouth daily.    [provider]    Family History Family History  Problem Relation Age of Onset  . Heart attack Mother 27  . Stroke Father 53  . Alcohol abuse Father 39  . Alcoholism Brother 14    Social History Social History   Tobacco Use  . Smoking status: Former Smoker    Last attempt to quit: 03/25/1996    Years since quitting: 21.7  . Smokeless tobacco: Never Used  Substance Use Topics  . Alcohol use: Yes    Alcohol/week: 4.2 oz    Types: 7 Glasses of wine per week  . Drug use: Yes    Frequency: 1.0 times per week    Comment: 1 glass of wine daily     Allergies   Actonel [risedronate sodium]; Aspirin; Caffeine; Calcium; Codeine; Evista [raloxifene]; Fosamax [alendronate sodium]; Morphine; Penicillins; Prednisone; Propoxyphene hcl; Propoxyphene n-acetaminophen; and Sulfa antibiotics   Review of Systems Review of Systems  Unable to perform ROS: Dementia     Physical Exam Updated Vital Signs BP 113/81   Pulse 90   Temp 98.4 F (36.9 C) (Oral)   Resp 20   Ht 5\' 4"  (1.626 m)   Wt 63.5 kg (140 lb)   SpO2 95%   BMI 24.03 kg/m   Physical Exam Physical Exam  Nursing note and vitals reviewed. Constitutional: Well developed, well nourished, non-toxic, and in no acute distress Head: Normocephalic and atraumatic.  Mouth/Throat: Oropharynx is clear and moist.  Neck: Normal range of motion. Neck supple.  Cardiovascular: Normal rate and regular rhythm.   Pulmonary/Chest: Effort normal and breath sounds normal. No significant tenderness to palpation. Abdominal: Soft.  There is no tenderness. There is no rebound and no guarding.  Musculoskeletal: Normal range of motion.  Neurological: Alert, no facial droop, fluent speech, moves all extremities symmetrically Skin: Skin is warm and dry.  Psychiatric: Cooperative   ED Treatments / Results  Labs (all labs ordered are listed, but only abnormal results are displayed) Labs Reviewed  BASIC METABOLIC PANEL - Abnormal; Notable for the following components:      Result Value   Sodium 132 (*)    Glucose, Bld 103 (*)    All other components within normal limits  BRAIN NATRIURETIC PEPTIDE - Abnormal; Notable for the following  components:   B Natriuretic Peptide 100.6 (*)    All other components within normal limits  CBC WITH DIFFERENTIAL/PLATELET  I-STAT TROPONIN, ED  I-STAT TROPONIN, ED    EKG  EKG Interpretation  Date/Time:  Tuesday December 15 2017 16:52:08 EST Ventricular Rate:  79 PR Interval:    QRS Duration: 95 QT Interval:  373 QTC Calculation: 428 R Axis:   97 Text Interpretation:  Sinus rhythm Atrial premature complexes Probable left atrial enlargement Right axis deviation Baseline wander EKg similar to previous  Confirmed by Brantley Stage [phone]) on 12/15/2017 5:06:26 PM       Radiology Dg Chest 2 View  Result Date: 12/15/2017 CLINICAL DATA:  Center and right sided chest pain for the past week. History of atrial fibrillation, pacemaker placement, breast malignancy, former smoker. EXAM: CHEST  2 VIEW COMPARISON:  PA and lateral chest x-ray of November 07, 2017 FINDINGS: The lungs are well-expanded. The retrocardiac region on the left remains dense and the hemidiaphragm obscured. The cardiac silhouette is mildly enlarged. The pulmonary vascularity is normal. There is calcification in the wall of the aortic arch. The ICD is in stable position. There surgical clips in the right axillary region. IMPRESSION: Chronic bronchitic changes, stable. Stable enlargement cardiac silhouette without pulmonary  edema. This is felt to account for the increased retrocardiac density and obscuration of the left hemidiaphragm. No acute cardiopulmonary abnormality. Thoracic aortic atherosclerosis. Electronically Signed   By: David  Martinique M.D.   On: 12/15/2017 16:44    Procedures Procedures (including critical care time)  Medications Ordered in ED Medications - No data to display   Initial Impression / Assessment and Plan / ED Course  I have reviewed the triage vital signs and the nursing notes.  Pertinent labs & imaging results that were available during my care of the patient were reviewed by me and considered in my medical decision making (see chart for details).    Patient well-appearing, no acute distress with normal vital signs.  She is unable to provide any history due to her underlying dementia.  Currently she has no complaints.  Her chest pain seems very atypical, and has been ongoing for several weeks since an outbreak of shingles.  Records are reviewed.  She has no prior history of cardiac or pulmonary disease. She does point to around her left breast noting that she has had pain there before. This is area of where she had previous shingles and could still be persistent with post-herpetic neuralgia.  Her symptoms do seem very atypical, and has been intermittent and ongoing for several months.  I doubt that this is ACS related.  She has no other cardiac risk factors other than her age.  Her EKG is nonischemic and serial troponins are normal.  Chest x-ray visualized and shows no acute cardiopulmonary processes.  At this time I do not suspect serious or emergent etiologies of her pain. Strict return and follow-up instructions reviewed. She expressed understanding of all discharge instructions and felt comfortable with the plan of care.   Final Clinical Impressions(s) / ED Diagnoses   Final diagnoses:  Atypical chest pain    ED Discharge Orders    None       Forde Dandy, MD 12/15/17  2005

## 2017-12-15 NOTE — ED Notes (Signed)
Pt continues to remove monitor leads despite being left sticky note reminder for reorientation

## 2017-12-15 NOTE — ED Notes (Addendum)
Pt transported to xray Unable to draw all blood work, will reattempt when pt returns from xray

## 2017-12-15 NOTE — ED Notes (Signed)
Pt has St Jude pacemaker Pt's daughter also reports pt has recent shingles

## 2017-12-15 NOTE — Discharge Instructions (Signed)
Your chest pain work-up today has been reassuring. This may be due to postherpetic neuralgia from previous shingles.  Please follow-up with PCP for ongoing management  Return for worsening symptoms, including difficulty breathing, passing out, escalating pain or any other symptoms concerning to you.

## 2017-12-15 NOTE — ED Notes (Signed)
[facility] phoned by this RN, no record of the brand of pacemaker pt has. Will attempt contacting daughter

## 2017-12-15 NOTE — ED Notes (Signed)
Pt continues to pull of pulse ox and leads. Pt found trying to get out of bed. Pt assisted back into bed.

## 2017-12-15 NOTE — ED Notes (Signed)
PTAR arrived to brign pt back to [facility]

## 2017-12-16 DIAGNOSIS — B0229 Other postherpetic nervous system involvement: Secondary | ICD-10-CM | POA: Diagnosis not present

## 2017-12-19 ENCOUNTER — Encounter (HOSPITAL_COMMUNITY): Payer: Self-pay | Admitting: Emergency Medicine

## 2017-12-19 ENCOUNTER — Emergency Department (HOSPITAL_COMMUNITY)
Admission: EM | Admit: 2017-12-19 | Discharge: 2017-12-19 | Disposition: A | Discharge status: Home / Self Care | Payer: Medicare Other | Attending: Emergency Medicine | Admitting: Emergency Medicine

## 2017-12-19 DIAGNOSIS — I482 Chronic atrial fibrillation: Secondary | ICD-10-CM | POA: Diagnosis not present

## 2017-12-19 DIAGNOSIS — Z87820 Personal history of traumatic brain injury: Secondary | ICD-10-CM | POA: Diagnosis not present

## 2017-12-19 DIAGNOSIS — Z950 Presence of cardiac pacemaker: Secondary | ICD-10-CM | POA: Insufficient documentation

## 2017-12-19 DIAGNOSIS — R402441 Other coma, without documented Glasgow coma scale score, or with partial score reported, in the field [EMT or ambulance]: Secondary | ICD-10-CM | POA: Diagnosis not present

## 2017-12-19 DIAGNOSIS — R410 Disorientation, unspecified: Secondary | ICD-10-CM | POA: Insufficient documentation

## 2017-12-19 DIAGNOSIS — Z87891 Personal history of nicotine dependence: Secondary | ICD-10-CM | POA: Diagnosis not present

## 2017-12-19 DIAGNOSIS — Z79899 Other long term (current) drug therapy: Secondary | ICD-10-CM | POA: Insufficient documentation

## 2017-12-19 DIAGNOSIS — R52 Pain, unspecified: Secondary | ICD-10-CM | POA: Diagnosis present

## 2017-12-19 DIAGNOSIS — F0390 Unspecified dementia without behavioral disturbance: Secondary | ICD-10-CM | POA: Diagnosis not present

## 2017-12-19 DIAGNOSIS — Z853 Personal history of malignant neoplasm of breast: Secondary | ICD-10-CM | POA: Diagnosis not present

## 2017-12-19 DIAGNOSIS — R4182 Altered mental status, unspecified: Secondary | ICD-10-CM | POA: Diagnosis not present

## 2017-12-19 LAB — URINALYSIS, ROUTINE W REFLEX MICROSCOPIC
BILIRUBIN URINE: NEGATIVE
GLUCOSE, UA: NEGATIVE mg/dL
HGB URINE DIPSTICK: NEGATIVE
KETONES UR: 20 mg/dL — AB
Leukocytes, UA: NEGATIVE
Nitrite: NEGATIVE
PROTEIN: NEGATIVE mg/dL
Specific Gravity, Urine: 1.02 (ref 1.005–1.030)
pH: 5 (ref 5.0–8.0)

## 2017-12-19 MED ORDER — LORAZEPAM 2 MG/ML IJ SOLN
1.0000 mg | Freq: Once | INTRAMUSCULAR | Status: AC
  Administered 2017-12-19: 1 mg via INTRAMUSCULAR
  Filled 2017-12-19: qty 1

## 2017-12-19 NOTE — ED Notes (Addendum)
Patient removed purewick and refused staff to assist replacing in right place. Patient becoming agitated. Ambulated patient to restroom to obtain urine sample. Patient anxious and now allowing staff to help remove pants.

## 2017-12-19 NOTE — ED Notes (Signed)
Patient taken to bathroom using steady. Patient unable to void at this time.

## 2017-12-19 NOTE — ED Provider Notes (Signed)
[facility] DEPT Provider Note   CSN: [id number] Arrival date & time: 12/19/17  1535     History   Chief Complaint Chief Complaint  Patient presents with  . Generalized pain    HPI [name] is a 82 y.o. female.  HPI  Patient presents due to concern of pain, possible infection. Patient has dementia, level 5 caveat. The patient herself is a poor historian, offers inconsistent answers, responds that her mother will be so happy when she lifts her arms During neurologic exam, and is similar expressions throughout her initial evaluation. Per report the patient is a nursing home resident in a memory care unit. Reportedly the patient has complained of generalized pain for some time, and family is concerned about possible urinary tract infection. Patient had urinalysis drawn at her facility yesterday, but those results will not be available for another 3 days. Patient herself denies pain currently, denies discomfort, but again is confused. No nursing home report of fall, fever, vomiting, diarrhea.  Past Medical History:  Diagnosis Date  . Breast cancer ([place]) 1996   right side  . Dementia    forgetful  . FH: mastectomy    right side   . Hyponatremia   . Lichen planus   . Osteoporosis   . Pacemaker   . Sinus node dysfunction ([facility])    with syncope; status post implantation of a [employer] Zephyr XL model 5826 dual-chamber pacemaker  November 27, 2008.  . Subarachnoid hemorrhage following injury ([place])    right frontal, following traumatic brain injury    Patient Active Problem List   Diagnosis Date Noted  . GERD (gastroesophageal reflux disease) 10/07/2016  . OCD (obsessive compulsive disorder) 09/18/2016  . Acute bronchitis 09/08/2016  . Constipation 08/13/2016  . Memory change 04/06/2016  . Osteoporosis   . Pacemaker   . Lichen planus   . Atrial fibrillation 12/12/2013  . Sinus node dysfunction ([place]) 05/02/2011  .  Hyponatremia 05/10/2010  . PERSONAL HISTORY OF TRAUMATIC BRAIN INJURY 05/10/2010    Past Surgical History:  Procedure Laterality Date  . ABDOMINAL HYSTERECTOMY    . APPENDECTOMY    . BREAST ENHANCEMENT SURGERY Right 1997   By Trona Nation  . CESAREAN SECTION  1966  . MASTECTOMY Right 1996   By T. Davis  . PACEMAKER INSERTION  11/27/08   by Greggory Brandy ([facility])  . TONSILLECTOMY      OB History    No data available       Home Medications    Prior to Admission medications   Medication Sig Start Date End Date Taking? Authorizing Provider  acetaminophen (TYLENOL) 500 MG tablet Take 500 mg by mouth 2 (two) times daily.    [provider]  acetaminophen (TYLENOL) 500 MG tablet Take 500 mg by mouth every 4 (four) hours as needed.    [provider]  ALPRAZolam Duanne Moron) 0.25 MG tablet Take 0.25 mg by mouth 2 (two) times daily.    [provider]  cholecalciferol (VITAMIN D) 1000 UNITS tablet Take 1,000 Units by mouth. Take 2 daily    [provider]  gabapentin (NEURONTIN) 100 MG capsule Take 200 mg by mouth 3 (three) times daily.  11/09/17   [provider]  loratadine (CLARITIN) 10 MG tablet Take 10 mg by mouth daily.    [provider]  LORazepam (ATIVAN) 0.5 MG tablet Take 0.25 mg 2 (two) times daily by mouth.    [provider]  Multiple  Vitamin (MULITIVITAMIN WITH MINERALS) TABS Take 1 tablet by mouth daily. Dentist, Historical, MD  Omega-3 Fatty Acids (FISH OIL PO) Take 1 tablet by mouth daily.     [provider]  omeprazole (PRILOSEC) 20 MG capsule Take 20 mg by mouth daily.    [provider]  ranitidine (ZANTAC) 75 MG tablet Take 75 mg daily by mouth.    [provider]  risperiDONE (RISPERDAL) 0.25 MG tablet Take 0.25 mg at bedtime by mouth.     [provider]  risperiDONE (RISPERDAL) 0.5 MG tablet Take 0.5 mg by mouth daily.    [provider]  senna  (SENOKOT) 8.6 MG tablet Take 1 tablet by mouth at bedtime.     [provider]  sertraline (ZOLOFT) 50 MG tablet Take 75 mg daily by mouth.    [provider]    Family History Family History  Problem Relation Age of Onset  . Heart attack Mother 59  . Stroke Father 51  . Alcohol abuse Father 2  . Alcoholism Brother 19    Social History Social History   Tobacco Use  . Smoking status: Former Smoker    Last attempt to quit: 03/25/1996    Years since quitting: 21.7  . Smokeless tobacco: Never Used  Substance Use Topics  . Alcohol use: Yes    Alcohol/week: 4.2 oz    Types: 7 Glasses of wine per week  . Drug use: Yes    Frequency: 1.0 times per week    Comment: 1 glass of wine daily     Allergies   Actonel [risedronate sodium]; Aspirin; Caffeine; Calcium; Codeine; Evista [raloxifene]; Fosamax [alendronate sodium]; Morphine; Penicillins; Prednisone; Propoxyphene hcl; Propoxyphene n-acetaminophen; and Sulfa antibiotics   Review of Systems Review of Systems  Unable to perform ROS: Dementia     Physical Exam Updated Vital Signs BP 128/83   Pulse 75   Temp 97.7 F (36.5 C) (Oral)   Resp 18   SpO2 96%   Physical Exam  Constitutional: She appears well-nourished. No distress.  Frail female awake and alert, withdrawn, answers questions inconsistently  HENT:  Head: Normocephalic and atraumatic.  Eyes: Conjunctivae and EOM are normal.  Cardiovascular: Normal rate and regular rhythm.  Pulmonary/Chest: Effort normal and breath sounds normal. No stridor. No respiratory distress.  Abdominal: She exhibits no distension.  Musculoskeletal: She exhibits no edema.  Neurological: She is alert. She displays atrophy. She displays no tremor. No cranial nerve deficit. She displays no seizure activity.  Skin: Skin is warm and dry.  Psychiatric: She is slowed and withdrawn. Cognition and memory are impaired.  Nursing note and vitals reviewed.    ED Treatments /  Results  Labs (all labs ordered are listed, but only abnormal results are displayed) Labs Reviewed  URINALYSIS, ROUTINE W REFLEX MICROSCOPIC   Procedures Procedures (including critical care time)  Medications Ordered in ED Medications - No data to display   Initial Impression / Assessment and Plan / ED Course  I have reviewed the triage vital signs and the nursing notes.  Pertinent labs & imaging results that were available during my care of the patient were reviewed by me and considered in my medical decision making (see chart for details).  9:20 PM Urinalysis unremarkable.  Patient agitated with direct conversation, but remains otherwise similar to arrival.  This elderly female with dementia presents from her primary care unit due to concern of possible change from baseline behavior.  Here the patient is initially pleasant, interactive, though clearly with cognitive deficits. Patient's evaluation here, with vital signs, monitoring, urinalysis all reassuring. Patient has no cough, no fever, occult pneumonia unlikely. No evidence for bacteremia, sepsis. Patient discharged in stable condition back to her nursing facility.  Final Clinical Impressions(s) / ED Diagnoses   Final diagnoses:  Confusion     Carmin Muskrat, MD 12/19/17 2121

## 2017-12-19 NOTE — ED Notes (Signed)
Bed: AC45 Expected date: 12/19/17 Expected time:  Means of arrival:  Comments: Possible UTI pt

## 2017-12-19 NOTE — ED Notes (Signed)
Pt has become increasingly agitated and moderately combative. It took 2 Therapist, sports and 1 NT to escort pt from bathroom to room.

## 2017-12-19 NOTE — ED Notes (Signed)
Patients daughter Almyra Free [phone] called and would like to be notified with any updates on patient.

## 2017-12-19 NOTE — ED Notes (Signed)
Patient given sandwich and water

## 2017-12-19 NOTE — ED Notes (Signed)
Patient refusing to allow staff to obtain vital signs.

## 2017-12-19 NOTE — ED Triage Notes (Signed)
Per EMS pt from [facility] due to family requesting urinalysis. Patient diagnosed with shingles last week and prescribed tramadol. Family does not think pain medication is working. Pt usually ambulates to restroom by self however had episode of incontinence today. Baseline dementia.

## 2017-12-19 NOTE — Discharge Instructions (Signed)
As discussed, your evaluation today has been largely reassuring.  But, it is important that you monitor your condition carefully, and do not hesitate to return to the ED if you develop new, or concerning changes in your condition. ? ?Otherwise, please follow-up with your physician for appropriate ongoing care. ? ?

## 2017-12-19 NOTE — ED Notes (Signed)
Pure wick placed.

## 2017-12-22 ENCOUNTER — Encounter (HOSPITAL_COMMUNITY): Payer: Self-pay

## 2017-12-22 ENCOUNTER — Other Ambulatory Visit: Payer: Self-pay

## 2017-12-22 ENCOUNTER — Emergency Department (HOSPITAL_COMMUNITY)
Admission: EM | Admit: 2017-12-22 | Discharge: 2017-12-23 | Disposition: A | Discharge status: Home / Self Care | Payer: Medicare Other | Attending: Emergency Medicine | Admitting: Emergency Medicine

## 2017-12-22 ENCOUNTER — Emergency Department (HOSPITAL_COMMUNITY): Payer: Medicare Other

## 2017-12-22 DIAGNOSIS — B999 Unspecified infectious disease: Secondary | ICD-10-CM | POA: Diagnosis not present

## 2017-12-22 DIAGNOSIS — R402441 Other coma, without documented Glasgow coma scale score, or with partial score reported, in the field [EMT or ambulance]: Secondary | ICD-10-CM | POA: Diagnosis not present

## 2017-12-22 DIAGNOSIS — R4182 Altered mental status, unspecified: Secondary | ICD-10-CM | POA: Diagnosis not present

## 2017-12-22 DIAGNOSIS — Z79899 Other long term (current) drug therapy: Secondary | ICD-10-CM | POA: Diagnosis not present

## 2017-12-22 DIAGNOSIS — M792 Neuralgia and neuritis, unspecified: Secondary | ICD-10-CM | POA: Diagnosis not present

## 2017-12-22 DIAGNOSIS — Z87891 Personal history of nicotine dependence: Secondary | ICD-10-CM | POA: Diagnosis not present

## 2017-12-22 DIAGNOSIS — B0229 Other postherpetic nervous system involvement: Secondary | ICD-10-CM

## 2017-12-22 DIAGNOSIS — R4020 Unspecified coma: Secondary | ICD-10-CM | POA: Diagnosis not present

## 2017-12-22 DIAGNOSIS — F0390 Unspecified dementia without behavioral disturbance: Secondary | ICD-10-CM | POA: Diagnosis not present

## 2017-12-22 LAB — CBC WITH DIFFERENTIAL/PLATELET
BASOS ABS: 0 10*3/uL (ref 0.0–0.1)
Basophils Relative: 0 %
EOS PCT: 1 %
Eosinophils Absolute: 0 10*3/uL (ref 0.0–0.7)
HEMATOCRIT: 38.7 % (ref 36.0–46.0)
Hemoglobin: 13.1 g/dL (ref 12.0–15.0)
LYMPHS PCT: 14 %
Lymphs Abs: 0.9 10*3/uL (ref 0.7–4.0)
MCH: 29.7 pg (ref 26.0–34.0)
MCHC: 33.9 g/dL (ref 30.0–36.0)
MCV: 87.8 fL (ref 78.0–100.0)
Monocytes Absolute: 0.7 10*3/uL (ref 0.1–1.0)
Monocytes Relative: 11 %
NEUTROS ABS: 4.6 10*3/uL (ref 1.7–7.7)
NEUTROS PCT: 74 %
PLATELETS: 162 10*3/uL (ref 150–400)
RBC: 4.41 MIL/uL (ref 3.87–5.11)
RDW: 14.9 % (ref 11.5–15.5)
WBC: 6.2 10*3/uL (ref 4.0–10.5)

## 2017-12-22 LAB — I-STAT CHEM 8, ED
BUN: 24 mg/dL — ABNORMAL HIGH (ref 6–20)
CHLORIDE: 101 mmol/L (ref 101–111)
Calcium, Ion: 1.27 mmol/L (ref 1.15–1.40)
Creatinine, Ser: 0.7 mg/dL (ref 0.44–1.00)
GLUCOSE: 113 mg/dL — AB (ref 65–99)
HCT: 37 % (ref 36.0–46.0)
Hemoglobin: 12.6 g/dL (ref 12.0–15.0)
POTASSIUM: 3.8 mmol/L (ref 3.5–5.1)
Sodium: 138 mmol/L (ref 135–145)
TCO2: 25 mmol/L (ref 22–32)

## 2017-12-22 MED ORDER — GABAPENTIN 100 MG PO CAPS
200.0000 mg | ORAL_CAPSULE | Freq: Once | ORAL | Status: AC
  Administered 2017-12-22: 200 mg via ORAL
  Filled 2017-12-22: qty 2

## 2017-12-22 MED ORDER — LIDOCAINE 5 % EX PTCH
1.0000 | MEDICATED_PATCH | CUTANEOUS | Status: DC
  Administered 2017-12-22: 1 via TRANSDERMAL
  Filled 2017-12-22: qty 1

## 2017-12-22 MED ORDER — TRAMADOL HCL 50 MG PO TABS
50.0000 mg | ORAL_TABLET | Freq: Once | ORAL | Status: AC
  Administered 2017-12-22: 50 mg via ORAL
  Filled 2017-12-22: qty 1

## 2017-12-22 MED ORDER — LIDOCAINE 5 % EX PTCH: 1.0000 | MEDICATED_PATCH | CUTANEOUS | 0 refills | Status: AC

## 2017-12-22 NOTE — ED Notes (Signed)
Bed: AN19 Expected date:  Expected time:  Means of arrival:  Comments: Ems-DEMENTIA PATIENT

## 2017-12-22 NOTE — ED Notes (Signed)
Patient transported to CT 

## 2017-12-22 NOTE — ED Provider Notes (Signed)
[facility] DEPT Provider Note   CSN: [id number] Arrival date & time: 12/22/17  1441     History   Chief Complaint Chief Complaint  Patient presents with  . Generalized Body Pain    HPI [name] is a 82 y.o. female. Level 5 caveat due to dementia. HPI Patient presents with reported mental status changes.  Reportedly has been going on for the last couple months since she has been diagnosed with shingles.  Had severe shingles pain but then the rash resolved and the pain initially improved but then came back.  Had been on tramadol and Neurontin without relief.  Patient's daughter who  has not seen her in the last couple months states that the patient is gotten worse.  Patient's other daughter is a Marine scientist and we have been attempting to reach her on the phone because she also would like to talk to me.  Patient's daughter states that she thinks that the patient's breast cancer came back because shingles cannot make her hurt like this or make her more confused.  Patient's been seen by her primary care doctor through the nursing home and had a reported complete long and cardiac workup.  Has had some swelling in her feet.  Has had urinalysis did not show infection. Past Medical History:  Diagnosis Date  . Breast cancer ([place]) 1996   right side  . Dementia    forgetful  . FH: mastectomy    right side   . Hyponatremia   . Lichen planus   . Osteoporosis   . Pacemaker   . Sinus node dysfunction ([facility])    with syncope; status post implantation of a [employer]. [employer] Medical Zephyr XL model 5826 dual-chamber pacemaker  November 27, 2008.  . Subarachnoid hemorrhage following injury ([place])    right frontal, following traumatic brain injury    Patient Active Problem List   Diagnosis Date Noted  . GERD (gastroesophageal reflux disease) 10/07/2016  . OCD (obsessive compulsive disorder) 09/18/2016  . Acute bronchitis 09/08/2016  . Constipation 08/13/2016  . Memory  change 04/06/2016  . Osteoporosis   . Pacemaker   . Lichen planus   . Atrial fibrillation 12/12/2013  . Sinus node dysfunction ([place]) 05/02/2011  . Hyponatremia 05/10/2010  . PERSONAL HISTORY OF TRAUMATIC BRAIN INJURY 05/10/2010    Past Surgical History:  Procedure Laterality Date  . ABDOMINAL HYSTERECTOMY    . APPENDECTOMY    . BREAST ENHANCEMENT SURGERY Right 1997   By [place] Nation  . CESAREAN SECTION  1966  . MASTECTOMY Right 1996   By T. Davis  . PACEMAKER INSERTION  11/27/08   by Greggory Brandy ([facility])  . TONSILLECTOMY      OB History    No data available       Home Medications    Prior to Admission medications   Medication Sig Start Date End Date Taking? Authorizing Provider  acetaminophen (TYLENOL) 500 MG tablet Take 500 mg by mouth every 4 (four) hours as needed.   Yes [provider]  cholecalciferol (VITAMIN D) 1000 UNITS tablet Take 1,000 Units by mouth. Take 2 daily   Yes [provider]  gabapentin (NEURONTIN) 100 MG capsule Take 200 mg by mouth 3 (three) times daily.  11/09/17  Yes [provider]  LORazepam (ATIVAN) 0.5 MG tablet Take 0.25 mg 2 (two) times daily by mouth.   Yes [provider]  Multiple Vitamin (MULITIVITAMIN WITH MINERALS) TABS Take 1 tablet by mouth daily. [employer]  Senior  tabs   Yes [provider]  Omega-3 Fatty Acids (FISH OIL PO) Take 1 tablet by mouth daily.    Yes [provider]  ranitidine (ZANTAC) 75 MG tablet Take 75 mg daily by mouth.   Yes [provider]  risperiDONE (RISPERDAL) 0.25 MG tablet Take 0.25 mg at bedtime by mouth.    Yes [provider]  risperiDONE (RISPERDAL) 0.5 MG tablet Take 0.5 mg by mouth daily.   Yes [provider]  senna (SENOKOT) 8.6 MG tablet Take 1 tablet by mouth at bedtime.    Yes [provider]  sertraline (ZOLOFT) 50 MG tablet Take 75 mg daily by mouth.   Yes [provider]  traMADol (ULTRAM) 50 MG tablet Take by  mouth 2 (two) times daily.   Yes [provider]  lidocaine (LIDODERM) 5 % Place 1 patch onto the skin daily. Remove & Discard patch within 12 hours or as directed by MD 12/22/17   Davonna Belling, MD    Family History Family History  Problem Relation Age of Onset  . Heart attack Mother 91  . Stroke Father 72  . Alcohol abuse Father 56  . Alcoholism Brother 46    Social History Social History   Tobacco Use  . Smoking status: Former Smoker    Last attempt to quit: 03/25/1996    Years since quitting: 21.7  . Smokeless tobacco: Never Used  Substance Use Topics  . Alcohol use: Yes    Alcohol/week: 4.2 oz    Types: 7 Glasses of wine per week  . Drug use: No     Allergies   Actonel [risedronate sodium]; Aspirin; Caffeine; Calcium; Codeine; Evista [raloxifene]; Fosamax [alendronate sodium]; Morphine; Penicillins; Prednisone; Propoxyphene hcl; Propoxyphene n-acetaminophen; and Sulfa antibiotics   Review of Systems Review of Systems  Unable to perform ROS: Dementia     Physical Exam Updated Vital Signs BP (!) 138/94 (BP Location: Right Arm) Comment: Simultaneous filing. User may not have seen previous data.  Pulse 72 Comment: Simultaneous filing. User may not have seen previous data.  Temp 98.6 F (37 C) (Oral)   Resp 15 Comment: Simultaneous filing. User may not have seen previous data.  Ht 5\' 4"  (1.626 m)   Wt 61.2 kg (135 lb)   SpO2 94% Comment: Simultaneous filing. User may not have seen previous data.  BMI 23.17 kg/m   Physical Exam  Constitutional: She appears well-developed.  HENT:  Head: Atraumatic.  Eyes: EOM are normal.  Neck: Neck supple.  Cardiovascular: Normal rate.  Pulmonary/Chest: Effort normal.  Abdominal: Soft. There is no tenderness.  Musculoskeletal: She exhibits no edema or tenderness.  Neurological: She is alert.  Patient is awake and will look at me.  Will follow commands but is not very verbal.  Is somewhat confused this is has  been her baseline over the last couple weeks but reportedly was not this confused for that.  Skin: Skin is warm. Capillary refill takes less than 2 seconds. No rash noted.  No rash at area of previous shingles in right axilla.     ED Treatments / Results  Labs (all labs ordered are listed, but only abnormal results are displayed) Labs Reviewed  I-STAT CHEM 8, ED - Abnormal; Notable for the following components:      Result Value   BUN 24 (*)    Glucose, Bld 113 (*)    All other components within normal limits  CBC WITH DIFFERENTIAL/PLATELET    EKG  EKG Interpretation None       Radiology Ct Head Wo Contrast  Result Date: 12/22/2017 CLINICAL DATA:  Altered level of consciousness EXAM: CT HEAD WITHOUT CONTRAST TECHNIQUE: Contiguous axial images were obtained from the base of the skull through the vertex without intravenous contrast. COMPARISON:  02/14/2009 FINDINGS: Brain: No acute territorial infarction, hemorrhage or intracranial mass is visualized. Moderate atrophy, progressed since prior. Minimal small vessel ischemic changes of the white matter. Slight interval enlargement of ventricles, felt secondary to progression of atrophy. Encephalomalacia the right temporal lobe, corresponding to the site of remote hemorrhage. Vascular: No hyperdense vessels. Carotid artery calcification and vertebral artery calcification Skull: No fracture or suspicious lesion Sinuses/Orbits: Mild mucosal thickening in the sinuses. No orbital soft tissue stranding is seen. Small locule of air superior to the left globe with mildly prominent air collections along the anterior roofs of the orbits of uncertain etiology or clinical significance. Other: None IMPRESSION: 1. No definite CT evidence for acute intracranial abnormality. 2. Prominent atrophy. Encephalomalacia in the right temporal lobe at the site of remote hemorrhage. Minimal small vessel ischemic changes of the white matter. Electronically Signed   By:  Donavan Foil M.D.   On: 12/22/2017 16:56    Procedures Procedures (including critical care time)  Medications Ordered in ED Medications  lidocaine (LIDODERM) 5 % 1 patch (1 patch Transdermal Patch Applied 12/22/17 1838)  gabapentin (NEURONTIN) capsule 200 mg (200 mg Oral Given 12/22/17 1837)  traMADol (ULTRAM) tablet 50 mg (50 mg Oral Given 12/22/17 1838)     Initial Impression / Assessment and Plan / ED Course  I have reviewed the triage vital signs and the nursing notes.  Pertinent labs & imaging results that were available during my care of the patient were reviewed by me and considered in my medical decision making (see chart for details).     Patient with mental status changes.  Recent shingles and does have a postherpetic neuralgia.  Has had worsening of mental status which could either be due to the pain or also potentially could be due to the treatment for it.  We will add Lidoderm patch.  Pain reportedly gets severe.  Will need to follow-up with her PCP labs reviewed from previous visits.  I discussed with the patient's head CT done due to mental status changes and previous cancer.  I discussed with the patient's daughter who is here and daughter who normally lives here and is now in [place].  Final Clinical Impressions(s) / ED Diagnoses   Final diagnoses:  Post herpetic neuralgia    ED Discharge Orders        Ordered    lidocaine (LIDODERM) 5 %  Every 24 hours     12/22/17 1820       Davonna Belling, MD 12/22/17 2341

## 2017-12-22 NOTE — ED Notes (Signed)
[facility] called and notified.

## 2017-12-22 NOTE — Discharge Instructions (Signed)
Follow-up for further evaluation of the pain and mental status changes.  Could be due to postherpetic neuralgia or some the mental status could also be due to the medication she has been given to help with the pain.

## 2017-12-23 DIAGNOSIS — B0229 Other postherpetic nervous system involvement: Secondary | ICD-10-CM | POA: Diagnosis not present

## 2017-12-23 DIAGNOSIS — M1990 Unspecified osteoarthritis, unspecified site: Secondary | ICD-10-CM | POA: Diagnosis not present

## 2017-12-23 DIAGNOSIS — R2689 Other abnormalities of gait and mobility: Secondary | ICD-10-CM | POA: Diagnosis not present

## 2017-12-24 DIAGNOSIS — F419 Anxiety disorder, unspecified: Secondary | ICD-10-CM | POA: Diagnosis not present

## 2017-12-24 DIAGNOSIS — F064 Anxiety disorder due to known physiological condition: Secondary | ICD-10-CM | POA: Diagnosis not present

## 2017-12-24 DIAGNOSIS — K5900 Constipation, unspecified: Secondary | ICD-10-CM | POA: Diagnosis not present

## 2017-12-24 DIAGNOSIS — E785 Hyperlipidemia, unspecified: Secondary | ICD-10-CM | POA: Diagnosis not present

## 2017-12-24 DIAGNOSIS — K219 Gastro-esophageal reflux disease without esophagitis: Secondary | ICD-10-CM | POA: Diagnosis not present

## 2017-12-24 DIAGNOSIS — G301 Alzheimer's disease with late onset: Secondary | ICD-10-CM | POA: Diagnosis not present

## 2017-12-24 DIAGNOSIS — F0390 Unspecified dementia without behavioral disturbance: Secondary | ICD-10-CM | POA: Diagnosis not present

## 2017-12-24 DIAGNOSIS — E559 Vitamin D deficiency, unspecified: Secondary | ICD-10-CM | POA: Diagnosis not present

## 2017-12-24 DIAGNOSIS — F330 Major depressive disorder, recurrent, mild: Secondary | ICD-10-CM | POA: Diagnosis not present

## 2017-12-24 DIAGNOSIS — F0391 Unspecified dementia with behavioral disturbance: Secondary | ICD-10-CM | POA: Diagnosis not present

## 2017-12-24 DIAGNOSIS — M810 Age-related osteoporosis without current pathological fracture: Secondary | ICD-10-CM | POA: Diagnosis not present

## 2017-12-26 DIAGNOSIS — B351 Tinea unguium: Secondary | ICD-10-CM | POA: Diagnosis not present

## 2017-12-28 DIAGNOSIS — R262 Difficulty in walking, not elsewhere classified: Secondary | ICD-10-CM | POA: Diagnosis not present

## 2017-12-28 DIAGNOSIS — M6281 Muscle weakness (generalized): Secondary | ICD-10-CM | POA: Diagnosis not present

## 2017-12-30 DIAGNOSIS — R488 Other symbolic dysfunctions: Secondary | ICD-10-CM | POA: Diagnosis not present

## 2017-12-30 DIAGNOSIS — R262 Difficulty in walking, not elsewhere classified: Secondary | ICD-10-CM | POA: Diagnosis not present

## 2017-12-30 DIAGNOSIS — M6281 Muscle weakness (generalized): Secondary | ICD-10-CM | POA: Diagnosis not present

## 2017-12-30 DIAGNOSIS — R278 Other lack of coordination: Secondary | ICD-10-CM | POA: Diagnosis not present

## 2017-12-31 DIAGNOSIS — M6281 Muscle weakness (generalized): Secondary | ICD-10-CM | POA: Diagnosis not present

## 2017-12-31 DIAGNOSIS — B0229 Other postherpetic nervous system involvement: Secondary | ICD-10-CM | POA: Diagnosis not present

## 2017-12-31 DIAGNOSIS — M810 Age-related osteoporosis without current pathological fracture: Secondary | ICD-10-CM | POA: Diagnosis not present

## 2017-12-31 DIAGNOSIS — R278 Other lack of coordination: Secondary | ICD-10-CM | POA: Diagnosis not present

## 2017-12-31 DIAGNOSIS — R4182 Altered mental status, unspecified: Secondary | ICD-10-CM | POA: Diagnosis not present

## 2017-12-31 DIAGNOSIS — K219 Gastro-esophageal reflux disease without esophagitis: Secondary | ICD-10-CM | POA: Diagnosis not present

## 2017-12-31 DIAGNOSIS — R262 Difficulty in walking, not elsewhere classified: Secondary | ICD-10-CM | POA: Diagnosis not present

## 2017-12-31 DIAGNOSIS — E785 Hyperlipidemia, unspecified: Secondary | ICD-10-CM | POA: Diagnosis not present

## 2017-12-31 DIAGNOSIS — B029 Zoster without complications: Secondary | ICD-10-CM | POA: Diagnosis not present

## 2017-12-31 DIAGNOSIS — R488 Other symbolic dysfunctions: Secondary | ICD-10-CM | POA: Diagnosis not present

## 2017-12-31 DIAGNOSIS — E559 Vitamin D deficiency, unspecified: Secondary | ICD-10-CM | POA: Diagnosis not present

## 2018-01-01 DIAGNOSIS — M6281 Muscle weakness (generalized): Secondary | ICD-10-CM | POA: Diagnosis not present

## 2018-01-01 DIAGNOSIS — R278 Other lack of coordination: Secondary | ICD-10-CM | POA: Diagnosis not present

## 2018-01-01 DIAGNOSIS — R262 Difficulty in walking, not elsewhere classified: Secondary | ICD-10-CM | POA: Diagnosis not present

## 2018-01-01 DIAGNOSIS — R488 Other symbolic dysfunctions: Secondary | ICD-10-CM | POA: Diagnosis not present

## 2018-01-04 DIAGNOSIS — G301 Alzheimer's disease with late onset: Secondary | ICD-10-CM | POA: Diagnosis not present

## 2018-01-04 DIAGNOSIS — R488 Other symbolic dysfunctions: Secondary | ICD-10-CM | POA: Diagnosis not present

## 2018-01-04 DIAGNOSIS — F330 Major depressive disorder, recurrent, mild: Secondary | ICD-10-CM | POA: Diagnosis not present

## 2018-01-04 DIAGNOSIS — R262 Difficulty in walking, not elsewhere classified: Secondary | ICD-10-CM | POA: Diagnosis not present

## 2018-01-04 DIAGNOSIS — M6281 Muscle weakness (generalized): Secondary | ICD-10-CM | POA: Diagnosis not present

## 2018-01-04 DIAGNOSIS — R278 Other lack of coordination: Secondary | ICD-10-CM | POA: Diagnosis not present

## 2018-01-04 DIAGNOSIS — F0281 Dementia in other diseases classified elsewhere with behavioral disturbance: Secondary | ICD-10-CM | POA: Diagnosis not present

## 2018-01-04 DIAGNOSIS — F064 Anxiety disorder due to known physiological condition: Secondary | ICD-10-CM | POA: Diagnosis not present

## 2018-01-05 DIAGNOSIS — R488 Other symbolic dysfunctions: Secondary | ICD-10-CM | POA: Diagnosis not present

## 2018-01-05 DIAGNOSIS — R278 Other lack of coordination: Secondary | ICD-10-CM | POA: Diagnosis not present

## 2018-01-05 DIAGNOSIS — R262 Difficulty in walking, not elsewhere classified: Secondary | ICD-10-CM | POA: Diagnosis not present

## 2018-01-05 DIAGNOSIS — M6281 Muscle weakness (generalized): Secondary | ICD-10-CM | POA: Diagnosis not present

## 2018-01-06 DIAGNOSIS — R488 Other symbolic dysfunctions: Secondary | ICD-10-CM | POA: Diagnosis not present

## 2018-01-06 DIAGNOSIS — M6281 Muscle weakness (generalized): Secondary | ICD-10-CM | POA: Diagnosis not present

## 2018-01-06 DIAGNOSIS — R262 Difficulty in walking, not elsewhere classified: Secondary | ICD-10-CM | POA: Diagnosis not present

## 2018-01-06 DIAGNOSIS — F0390 Unspecified dementia without behavioral disturbance: Secondary | ICD-10-CM | POA: Diagnosis not present

## 2018-01-06 DIAGNOSIS — R278 Other lack of coordination: Secondary | ICD-10-CM | POA: Diagnosis not present

## 2018-01-06 DIAGNOSIS — R634 Abnormal weight loss: Secondary | ICD-10-CM | POA: Diagnosis not present

## 2018-01-07 DIAGNOSIS — E46 Unspecified protein-calorie malnutrition: Secondary | ICD-10-CM | POA: Diagnosis not present

## 2018-01-07 DIAGNOSIS — F339 Major depressive disorder, recurrent, unspecified: Secondary | ICD-10-CM | POA: Diagnosis not present

## 2018-01-07 DIAGNOSIS — M810 Age-related osteoporosis without current pathological fracture: Secondary | ICD-10-CM | POA: Diagnosis not present

## 2018-01-07 DIAGNOSIS — I4891 Unspecified atrial fibrillation: Secondary | ICD-10-CM | POA: Diagnosis not present

## 2018-01-07 DIAGNOSIS — K219 Gastro-esophageal reflux disease without esophagitis: Secondary | ICD-10-CM | POA: Diagnosis not present

## 2018-01-07 DIAGNOSIS — G309 Alzheimer's disease, unspecified: Secondary | ICD-10-CM | POA: Diagnosis not present

## 2018-01-08 DIAGNOSIS — G309 Alzheimer's disease, unspecified: Secondary | ICD-10-CM | POA: Diagnosis not present

## 2018-01-08 DIAGNOSIS — E46 Unspecified protein-calorie malnutrition: Secondary | ICD-10-CM | POA: Diagnosis not present

## 2018-01-08 DIAGNOSIS — M810 Age-related osteoporosis without current pathological fracture: Secondary | ICD-10-CM | POA: Diagnosis not present

## 2018-01-08 DIAGNOSIS — I4891 Unspecified atrial fibrillation: Secondary | ICD-10-CM | POA: Diagnosis not present

## 2018-01-08 DIAGNOSIS — K219 Gastro-esophageal reflux disease without esophagitis: Secondary | ICD-10-CM | POA: Diagnosis not present

## 2018-01-08 DIAGNOSIS — F339 Major depressive disorder, recurrent, unspecified: Secondary | ICD-10-CM | POA: Diagnosis not present

## 2018-01-11 DIAGNOSIS — E46 Unspecified protein-calorie malnutrition: Secondary | ICD-10-CM | POA: Diagnosis not present

## 2018-01-11 DIAGNOSIS — M810 Age-related osteoporosis without current pathological fracture: Secondary | ICD-10-CM | POA: Diagnosis not present

## 2018-01-11 DIAGNOSIS — F339 Major depressive disorder, recurrent, unspecified: Secondary | ICD-10-CM | POA: Diagnosis not present

## 2018-01-11 DIAGNOSIS — G309 Alzheimer's disease, unspecified: Secondary | ICD-10-CM | POA: Diagnosis not present

## 2018-01-11 DIAGNOSIS — I4891 Unspecified atrial fibrillation: Secondary | ICD-10-CM | POA: Diagnosis not present

## 2018-01-11 DIAGNOSIS — K219 Gastro-esophageal reflux disease without esophagitis: Secondary | ICD-10-CM | POA: Diagnosis not present

## 2018-01-13 DIAGNOSIS — K219 Gastro-esophageal reflux disease without esophagitis: Secondary | ICD-10-CM | POA: Diagnosis not present

## 2018-01-13 DIAGNOSIS — G309 Alzheimer's disease, unspecified: Secondary | ICD-10-CM | POA: Diagnosis not present

## 2018-01-13 DIAGNOSIS — E46 Unspecified protein-calorie malnutrition: Secondary | ICD-10-CM | POA: Diagnosis not present

## 2018-01-13 DIAGNOSIS — M810 Age-related osteoporosis without current pathological fracture: Secondary | ICD-10-CM | POA: Diagnosis not present

## 2018-01-13 DIAGNOSIS — F339 Major depressive disorder, recurrent, unspecified: Secondary | ICD-10-CM | POA: Diagnosis not present

## 2018-01-13 DIAGNOSIS — I4891 Unspecified atrial fibrillation: Secondary | ICD-10-CM | POA: Diagnosis not present

## 2018-01-15 DIAGNOSIS — I4891 Unspecified atrial fibrillation: Secondary | ICD-10-CM | POA: Diagnosis not present

## 2018-01-15 DIAGNOSIS — M810 Age-related osteoporosis without current pathological fracture: Secondary | ICD-10-CM | POA: Diagnosis not present

## 2018-01-15 DIAGNOSIS — G309 Alzheimer's disease, unspecified: Secondary | ICD-10-CM | POA: Diagnosis not present

## 2018-01-15 DIAGNOSIS — K219 Gastro-esophageal reflux disease without esophagitis: Secondary | ICD-10-CM | POA: Diagnosis not present

## 2018-01-15 DIAGNOSIS — F339 Major depressive disorder, recurrent, unspecified: Secondary | ICD-10-CM | POA: Diagnosis not present

## 2018-01-15 DIAGNOSIS — E46 Unspecified protein-calorie malnutrition: Secondary | ICD-10-CM | POA: Diagnosis not present

## 2018-01-16 DIAGNOSIS — K219 Gastro-esophageal reflux disease without esophagitis: Secondary | ICD-10-CM | POA: Diagnosis not present

## 2018-01-16 DIAGNOSIS — M810 Age-related osteoporosis without current pathological fracture: Secondary | ICD-10-CM | POA: Diagnosis not present

## 2018-01-16 DIAGNOSIS — I4891 Unspecified atrial fibrillation: Secondary | ICD-10-CM | POA: Diagnosis not present

## 2018-01-16 DIAGNOSIS — F339 Major depressive disorder, recurrent, unspecified: Secondary | ICD-10-CM | POA: Diagnosis not present

## 2018-01-16 DIAGNOSIS — G309 Alzheimer's disease, unspecified: Secondary | ICD-10-CM | POA: Diagnosis not present

## 2018-01-16 DIAGNOSIS — E46 Unspecified protein-calorie malnutrition: Secondary | ICD-10-CM | POA: Diagnosis not present

## 2018-01-18 DIAGNOSIS — M810 Age-related osteoporosis without current pathological fracture: Secondary | ICD-10-CM | POA: Diagnosis not present

## 2018-01-18 DIAGNOSIS — F339 Major depressive disorder, recurrent, unspecified: Secondary | ICD-10-CM | POA: Diagnosis not present

## 2018-01-18 DIAGNOSIS — I4891 Unspecified atrial fibrillation: Secondary | ICD-10-CM | POA: Diagnosis not present

## 2018-01-18 DIAGNOSIS — K219 Gastro-esophageal reflux disease without esophagitis: Secondary | ICD-10-CM | POA: Diagnosis not present

## 2018-01-18 DIAGNOSIS — E46 Unspecified protein-calorie malnutrition: Secondary | ICD-10-CM | POA: Diagnosis not present

## 2018-01-18 DIAGNOSIS — G309 Alzheimer's disease, unspecified: Secondary | ICD-10-CM | POA: Diagnosis not present

## 2018-01-20 DIAGNOSIS — M545 Low back pain: Secondary | ICD-10-CM | POA: Diagnosis not present

## 2018-01-20 DIAGNOSIS — G894 Chronic pain syndrome: Secondary | ICD-10-CM | POA: Diagnosis not present

## 2018-01-21 DIAGNOSIS — I4891 Unspecified atrial fibrillation: Secondary | ICD-10-CM | POA: Diagnosis not present

## 2018-01-21 DIAGNOSIS — G309 Alzheimer's disease, unspecified: Secondary | ICD-10-CM | POA: Diagnosis not present

## 2018-01-21 DIAGNOSIS — K219 Gastro-esophageal reflux disease without esophagitis: Secondary | ICD-10-CM | POA: Diagnosis not present

## 2018-01-21 DIAGNOSIS — F339 Major depressive disorder, recurrent, unspecified: Secondary | ICD-10-CM | POA: Diagnosis not present

## 2018-01-21 DIAGNOSIS — E46 Unspecified protein-calorie malnutrition: Secondary | ICD-10-CM | POA: Diagnosis not present

## 2018-01-21 DIAGNOSIS — M810 Age-related osteoporosis without current pathological fracture: Secondary | ICD-10-CM | POA: Diagnosis not present

## 2018-01-22 DIAGNOSIS — F339 Major depressive disorder, recurrent, unspecified: Secondary | ICD-10-CM | POA: Diagnosis not present

## 2018-01-22 DIAGNOSIS — E46 Unspecified protein-calorie malnutrition: Secondary | ICD-10-CM | POA: Diagnosis not present

## 2018-01-22 DIAGNOSIS — K219 Gastro-esophageal reflux disease without esophagitis: Secondary | ICD-10-CM | POA: Diagnosis not present

## 2018-01-22 DIAGNOSIS — M810 Age-related osteoporosis without current pathological fracture: Secondary | ICD-10-CM | POA: Diagnosis not present

## 2018-01-22 DIAGNOSIS — G309 Alzheimer's disease, unspecified: Secondary | ICD-10-CM | POA: Diagnosis not present

## 2018-01-22 DIAGNOSIS — I4891 Unspecified atrial fibrillation: Secondary | ICD-10-CM | POA: Diagnosis not present

## 2018-01-28 DIAGNOSIS — K5900 Constipation, unspecified: Secondary | ICD-10-CM | POA: Diagnosis not present

## 2018-01-28 DIAGNOSIS — F419 Anxiety disorder, unspecified: Secondary | ICD-10-CM | POA: Diagnosis not present

## 2018-01-28 DIAGNOSIS — F330 Major depressive disorder, recurrent, mild: Secondary | ICD-10-CM | POA: Diagnosis not present

## 2018-01-28 DIAGNOSIS — F0391 Unspecified dementia with behavioral disturbance: Secondary | ICD-10-CM | POA: Diagnosis not present

## 2018-01-28 DIAGNOSIS — E785 Hyperlipidemia, unspecified: Secondary | ICD-10-CM | POA: Diagnosis not present

## 2018-01-28 DIAGNOSIS — M810 Age-related osteoporosis without current pathological fracture: Secondary | ICD-10-CM | POA: Diagnosis not present

## 2018-01-28 DIAGNOSIS — E559 Vitamin D deficiency, unspecified: Secondary | ICD-10-CM | POA: Diagnosis not present

## 2018-01-28 DIAGNOSIS — F0390 Unspecified dementia without behavioral disturbance: Secondary | ICD-10-CM | POA: Diagnosis not present

## 2018-01-28 DIAGNOSIS — B0229 Other postherpetic nervous system involvement: Secondary | ICD-10-CM | POA: Diagnosis not present

## 2018-01-28 DIAGNOSIS — K219 Gastro-esophageal reflux disease without esophagitis: Secondary | ICD-10-CM | POA: Diagnosis not present

## 2018-01-28 DIAGNOSIS — G301 Alzheimer's disease with late onset: Secondary | ICD-10-CM | POA: Diagnosis not present

## 2018-01-28 DIAGNOSIS — F064 Anxiety disorder due to known physiological condition: Secondary | ICD-10-CM | POA: Diagnosis not present

## 2018-01-29 DEATH — deceased

## 2018-02-24 DIAGNOSIS — M545 Low back pain: Secondary | ICD-10-CM | POA: Diagnosis not present

## 2018-02-24 DIAGNOSIS — E785 Hyperlipidemia, unspecified: Secondary | ICD-10-CM | POA: Diagnosis not present

## 2018-02-24 DIAGNOSIS — K219 Gastro-esophageal reflux disease without esophagitis: Secondary | ICD-10-CM | POA: Diagnosis not present

## 2018-02-24 DIAGNOSIS — F064 Anxiety disorder due to known physiological condition: Secondary | ICD-10-CM | POA: Diagnosis not present

## 2018-02-24 DIAGNOSIS — F330 Major depressive disorder, recurrent, mild: Secondary | ICD-10-CM | POA: Diagnosis not present

## 2018-02-24 DIAGNOSIS — G301 Alzheimer's disease with late onset: Secondary | ICD-10-CM | POA: Diagnosis not present

## 2018-02-24 DIAGNOSIS — B0229 Other postherpetic nervous system involvement: Secondary | ICD-10-CM | POA: Diagnosis not present

## 2018-02-24 DIAGNOSIS — F0390 Unspecified dementia without behavioral disturbance: Secondary | ICD-10-CM | POA: Diagnosis not present

## 2018-02-24 DIAGNOSIS — E559 Vitamin D deficiency, unspecified: Secondary | ICD-10-CM | POA: Diagnosis not present

## 2018-02-24 DIAGNOSIS — M810 Age-related osteoporosis without current pathological fracture: Secondary | ICD-10-CM | POA: Diagnosis not present

## 2018-02-24 DIAGNOSIS — F0391 Unspecified dementia with behavioral disturbance: Secondary | ICD-10-CM | POA: Diagnosis not present

## 2018-02-24 DIAGNOSIS — K5900 Constipation, unspecified: Secondary | ICD-10-CM | POA: Diagnosis not present

## 2018-07-16 ENCOUNTER — Encounter: Payer: Self-pay | Admitting: Cardiology

## 2019-02-01 IMAGING — CR DG CHEST 2V
2 series · 2 of 2 positions shown · non-contrast
Comparison: PA and lateral chest x-ray November 07, 2017

CLINICAL DATA: Center and right sided chest pain for the past week.
History of atrial fibrillation, pacemaker placement, breast
malignancy, former smoker.

EXAM:
CHEST  2 VIEW

[chest lat]
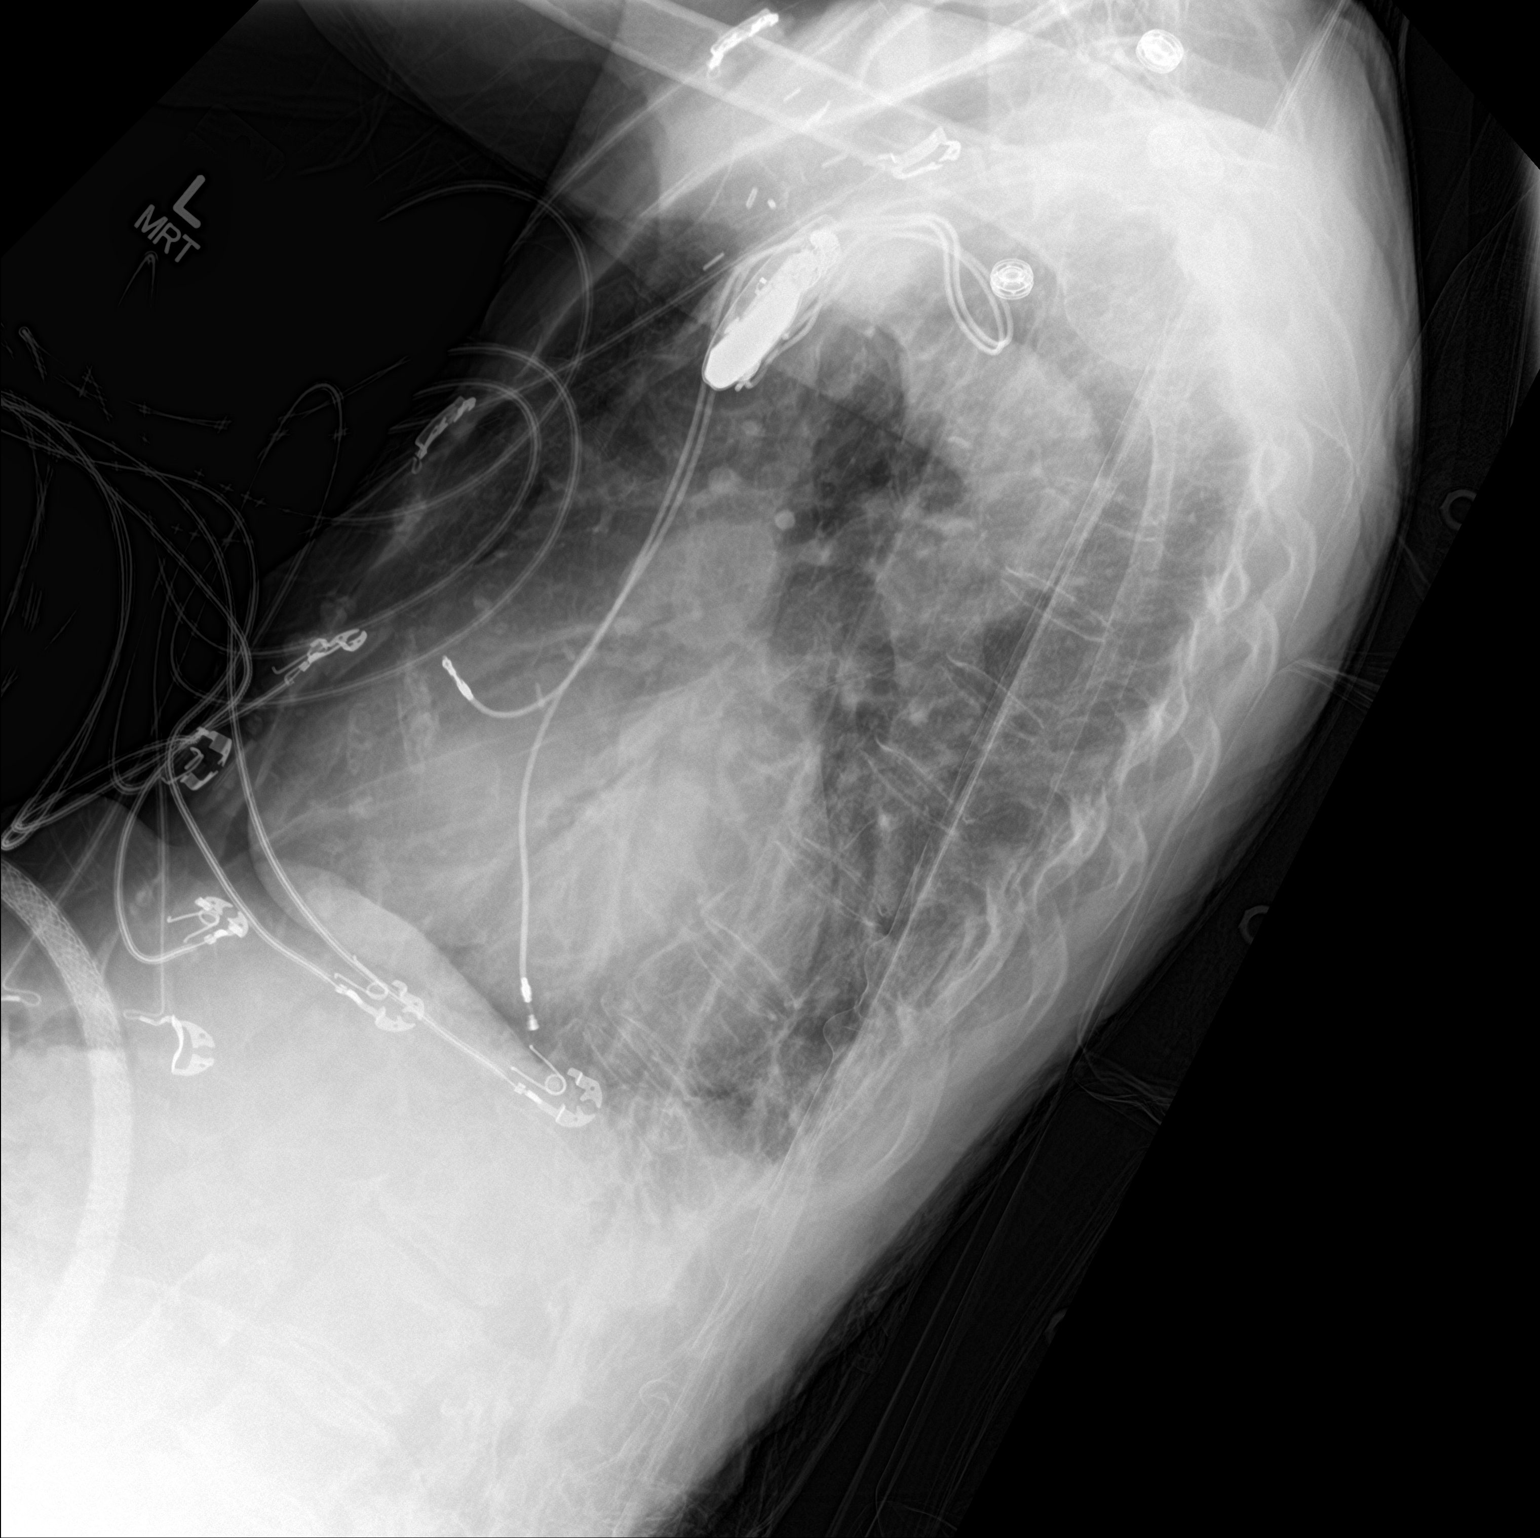

[chest ap]
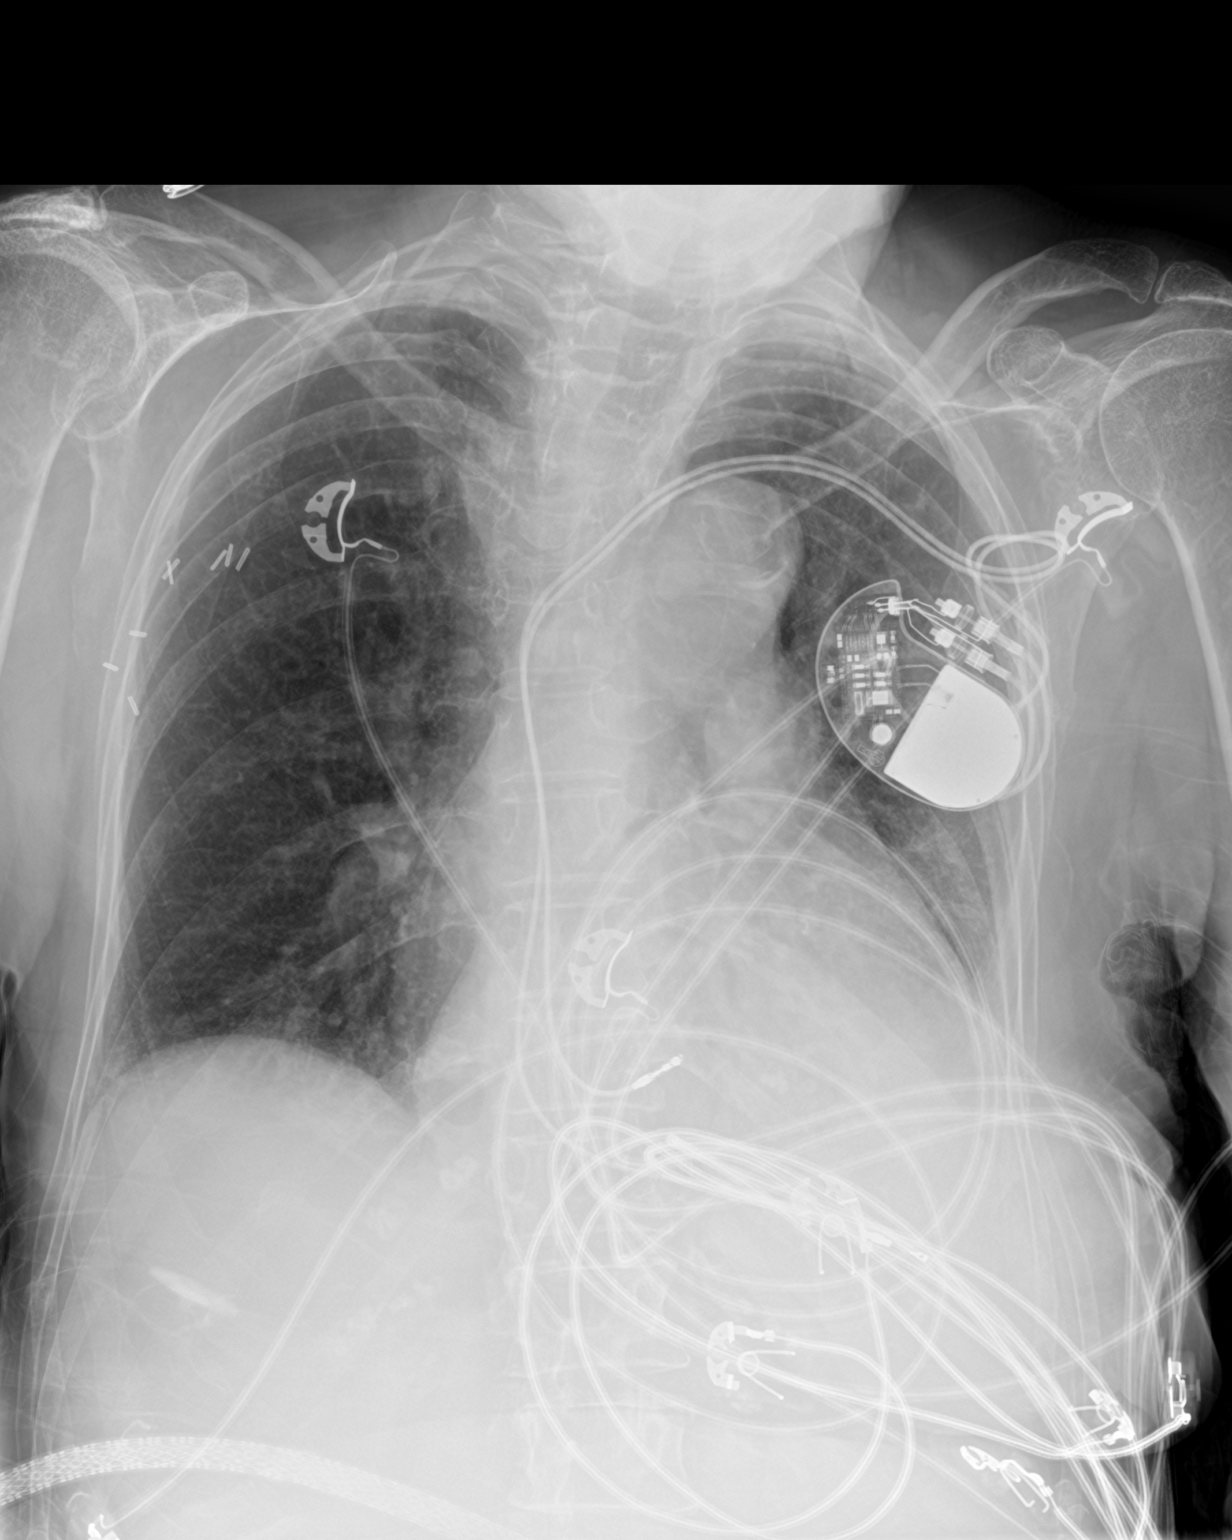

[2 of 2 positions shown; findings below may reference images not displayed]

FINDINGS: The lungs are well-expanded. The retrocardiac region on the left
remains dense and the hemidiaphragm obscured. The cardiac silhouette
is mildly enlarged. The pulmonary vascularity is normal. There is
calcification in the wall of the aortic arch. The ICD is in stable
position. There surgical clips in the right axillary region.
IMPRESSION: Chronic bronchitic changes, stable. Stable enlargement cardiac
silhouette without pulmonary edema. This is felt to account for the
increased retrocardiac density and obscuration of the left
hemidiaphragm. No acute cardiopulmonary abnormality.

Thoracic aortic atherosclerosis.
